# Patient Record
Sex: Male | Born: 1965 | Race: White | Hispanic: No | Marital: Married | Smoking: Former smoker
Health system: Southern US, Community
[De-identification: ages and names within clinical notes are randomized; demographics above are authoritative.]

## PROBLEM LIST (undated history)

## (undated) DIAGNOSIS — I10 Essential (primary) hypertension: Secondary | ICD-10-CM

## (undated) DIAGNOSIS — G629 Polyneuropathy, unspecified: Secondary | ICD-10-CM

## (undated) DIAGNOSIS — M545 Low back pain, unspecified: Secondary | ICD-10-CM

## (undated) DIAGNOSIS — G43109 Migraine with aura, not intractable, without status migrainosus: Secondary | ICD-10-CM

## (undated) DIAGNOSIS — E785 Hyperlipidemia, unspecified: Secondary | ICD-10-CM

## (undated) DIAGNOSIS — K219 Gastro-esophageal reflux disease without esophagitis: Secondary | ICD-10-CM

## (undated) DIAGNOSIS — F319 Bipolar disorder, unspecified: Secondary | ICD-10-CM

## (undated) DIAGNOSIS — J40 Bronchitis, not specified as acute or chronic: Secondary | ICD-10-CM

## (undated) DIAGNOSIS — J189 Pneumonia, unspecified organism: Secondary | ICD-10-CM

## (undated) DIAGNOSIS — G4730 Sleep apnea, unspecified: Secondary | ICD-10-CM

## (undated) DIAGNOSIS — E291 Testicular hypofunction: Secondary | ICD-10-CM

## (undated) DIAGNOSIS — E669 Obesity, unspecified: Secondary | ICD-10-CM

## (undated) DIAGNOSIS — G8929 Other chronic pain: Secondary | ICD-10-CM

## (undated) HISTORY — DX: Bipolar disorder, unspecified: F31.9

## (undated) HISTORY — DX: Testicular hypofunction: E29.1

## (undated) HISTORY — DX: Polyneuropathy, unspecified: G62.9

## (undated) HISTORY — DX: Gastro-esophageal reflux disease without esophagitis: K21.9

## (undated) HISTORY — DX: Migraine with aura, not intractable, without status migrainosus: G43.109

## (undated) HISTORY — DX: Sleep apnea, unspecified: G47.30

## (undated) HISTORY — DX: Low back pain, unspecified: M54.50

## (undated) HISTORY — DX: Essential (primary) hypertension: I10

## (undated) HISTORY — DX: Low back pain: M54.5

## (undated) HISTORY — DX: Obesity, unspecified: E66.9

## (undated) HISTORY — DX: Hyperlipidemia, unspecified: E78.5

## (undated) HISTORY — DX: Other chronic pain: G89.29

## (undated) HISTORY — PX: INNER EAR SURGERY: SHX679

---
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
## 1999-08-07 HISTORY — PX: BACK SURGERY: SHX140

## 2011-01-03 ENCOUNTER — Emergency Department: Payer: Self-pay | Admitting: Emergency Medicine

## 2011-01-29 ENCOUNTER — Emergency Department (INDEPENDENT_AMBULATORY_CARE_PROVIDER_SITE_OTHER): Payer: 59

## 2011-01-29 ENCOUNTER — Emergency Department (HOSPITAL_BASED_OUTPATIENT_CLINIC_OR_DEPARTMENT_OTHER)
Admission: EM | Admit: 2011-01-29 | Discharge: 2011-01-29 | Disposition: A | Discharge status: Home / Self Care | Payer: 59 | Attending: Emergency Medicine | Admitting: Emergency Medicine

## 2011-01-29 DIAGNOSIS — W19XXXA Unspecified fall, initial encounter: Secondary | ICD-10-CM

## 2011-01-29 DIAGNOSIS — M79609 Pain in unspecified limb: Secondary | ICD-10-CM | POA: Insufficient documentation

## 2011-01-29 DIAGNOSIS — G8929 Other chronic pain: Secondary | ICD-10-CM | POA: Insufficient documentation

## 2011-01-29 DIAGNOSIS — K219 Gastro-esophageal reflux disease without esophagitis: Secondary | ICD-10-CM | POA: Insufficient documentation

## 2011-01-29 DIAGNOSIS — I10 Essential (primary) hypertension: Secondary | ICD-10-CM | POA: Insufficient documentation

## 2011-03-18 ENCOUNTER — Emergency Department (INDEPENDENT_AMBULATORY_CARE_PROVIDER_SITE_OTHER): Payer: 59

## 2011-03-18 ENCOUNTER — Emergency Department (HOSPITAL_BASED_OUTPATIENT_CLINIC_OR_DEPARTMENT_OTHER)
Admission: EM | Admit: 2011-03-18 | Discharge: 2011-03-19 | Disposition: A | Discharge status: Home / Self Care | Payer: 59 | Attending: Emergency Medicine | Admitting: Emergency Medicine

## 2011-03-18 ENCOUNTER — Other Ambulatory Visit: Payer: Self-pay

## 2011-03-18 ENCOUNTER — Encounter: Payer: Self-pay | Admitting: *Deleted

## 2011-03-18 DIAGNOSIS — R05 Cough: Secondary | ICD-10-CM | POA: Insufficient documentation

## 2011-03-18 DIAGNOSIS — I498 Other specified cardiac arrhythmias: Secondary | ICD-10-CM | POA: Insufficient documentation

## 2011-03-18 DIAGNOSIS — R0602 Shortness of breath: Secondary | ICD-10-CM | POA: Insufficient documentation

## 2011-03-18 DIAGNOSIS — R000 Tachycardia, unspecified: Secondary | ICD-10-CM

## 2011-03-18 DIAGNOSIS — R0989 Other specified symptoms and signs involving the circulatory and respiratory systems: Secondary | ICD-10-CM

## 2011-03-18 DIAGNOSIS — R059 Cough, unspecified: Secondary | ICD-10-CM | POA: Insufficient documentation

## 2011-03-18 DIAGNOSIS — X58XXXA Exposure to other specified factors, initial encounter: Secondary | ICD-10-CM

## 2011-03-18 DIAGNOSIS — Z79899 Other long term (current) drug therapy: Secondary | ICD-10-CM | POA: Insufficient documentation

## 2011-03-18 DIAGNOSIS — S22009A Unspecified fracture of unspecified thoracic vertebra, initial encounter for closed fracture: Secondary | ICD-10-CM

## 2011-03-18 HISTORY — DX: Pneumonia, unspecified organism: J18.9

## 2011-03-18 HISTORY — DX: Bronchitis, not specified as acute or chronic: J40

## 2011-03-18 LAB — CARDIAC PANEL(CRET KIN+CKTOT+MB+TROPI): Total CK: 730 U/L — ABNORMAL HIGH (ref 7–232)

## 2011-03-18 LAB — DIFFERENTIAL
Eosinophils Relative: 1 % (ref 0–5)
Lymphocytes Relative: 21 % (ref 12–46)
Lymphs Abs: 2.5 10*3/uL (ref 0.7–4.0)
Monocytes Absolute: 1.2 10*3/uL — ABNORMAL HIGH (ref 0.1–1.0)
Monocytes Relative: 11 % (ref 3–12)

## 2011-03-18 LAB — BASIC METABOLIC PANEL
CO2: 25 mEq/L (ref 19–32)
Calcium: 9.1 mg/dL (ref 8.4–10.5)
Creatinine, Ser: 1.1 mg/dL (ref 0.50–1.35)

## 2011-03-18 LAB — CBC
HCT: 37.7 % — ABNORMAL LOW (ref 39.0–52.0)
MCV: 92.4 fL (ref 78.0–100.0)
RBC: 4.08 MIL/uL — ABNORMAL LOW (ref 4.22–5.81)
RDW: 12.7 % (ref 11.5–15.5)
WBC: 11.6 10*3/uL — ABNORMAL HIGH (ref 4.0–10.5)

## 2011-03-18 MED ORDER — ALBUTEROL SULFATE HFA 108 (90 BASE) MCG/ACT IN AERS
2.0000 | INHALATION_SPRAY | Freq: Once | RESPIRATORY_TRACT | Status: AC
  Administered 2011-03-19: 2 via RESPIRATORY_TRACT
  Filled 2011-03-18: qty 6.7

## 2011-03-18 MED ORDER — ALBUTEROL SULFATE HFA 108 (90 BASE) MCG/ACT IN AERS
2.0000 | INHALATION_SPRAY | RESPIRATORY_TRACT | Status: DC | PRN
Start: 2011-03-18 — End: 2013-02-09

## 2011-03-18 MED ORDER — AZITHROMYCIN 250 MG PO TABS: ORAL_TABLET | ORAL | Status: DC

## 2011-03-18 MED ORDER — IOHEXOL 350 MG/ML SOLN
80.0000 mL | Freq: Once | INTRAVENOUS | Status: AC | PRN
  Administered 2011-03-18: 80 mL via INTRAVENOUS

## 2011-03-18 MED ORDER — HYDROCOD POLST-CHLORPHEN POLST 10-8 MG/5ML PO LQCR
5.0000 mL | Freq: Once | ORAL | Status: AC
  Administered 2011-03-18: 5 mL via ORAL
  Filled 2011-03-18: qty 5

## 2011-03-18 MED ORDER — AZITHROMYCIN 250 MG PO TABS
500.0000 mg | ORAL_TABLET | Freq: Once | ORAL | Status: AC
  Administered 2011-03-18: 500 mg via ORAL
  Filled 2011-03-18: qty 2

## 2011-03-18 MED ORDER — HYDROCOD POLST-CHLORPHEN POLST 10-8 MG/5ML PO LQCR: ORAL | Status: DC

## 2011-03-18 NOTE — Patient Instructions (Signed)
Patient instructed on the correct method of effort to acquire PEFT. Pt's effort was excellent with readings of 400/500/500 with an average of 466. This being 73% of 637 being 73% of expected values.

## 2011-03-18 NOTE — ED Notes (Signed)
Pt states he has had a cough x 1 month with increased SHOB after his coughing episodes.

## 2011-03-18 NOTE — ED Provider Notes (Signed)
History   Scribed for [name], MD, the patient was seen in room MH04/MH04 . This chart was scribed by Desma Paganini. This patient's care was started at 8:42 PM.   CSN: [id number] Arrival date & time: 03/18/2011  8:51 PM  Chief Complaint  Patient presents with  . Cough   HPI David Prince is a 45 y.o. male who presents to the Emergency Department complaining of cough. Pt reports persistent cough x 1 month that is gradually worsening with increased SOB worse with exertion, deep breathing, and talking. Pt states uncontrollable coughing spells with associated substernal chest pain that only occurs with deep breathing and coughing. Also c/o fatigue and generalized weakness. +wheezing at night per wife. Denies swelling in legs, previous long trips, or a hx of blood clots. Reports using wife's Ventilin with transient relief.  Approximately one month ago, pt was dx with minor bronchitis at [facility] with same sx, given rx albuterol and Prednisone but sx continued to worsen. Unknown last pertussis dosing.  Pt reports hx of heart murmur, heart septum defect, as well as abnormal echo 4 years ago showing a "thing" in his heart but unsure of details. Pt was on Coumadin and ASA following echo findings but is no longer taking d/t severe bleeding with kidney stones. Takes Prevacid for moderate to severe acid reflux, 30mg  2x per day. Wife reports some cough at night thought to be due to reflux sxs, but this type of cough is usually followed by change in voice and acid brash in mouth in am.  No chest trauma.  No inhalational injury.  Pt was a smoker, but quit 4 years ago.    PCP Ozzie Hoyle   HPI ELEMENTS:  Duration:one month Timing: episodic Quality: non productive cough with SOB and chest pain Severity: moderate to severe Modifying factors: exertion, talking, deep breathing Context: as above  Associated symptoms: wheezing at night, fatigue and generalized weakness  PAST MEDICAL HISTORY:    Past Medical History  Diagnosis Date  . Bronchitis   . Pneumonia     PAST SURGICAL HISTORY:  Past Surgical History  Procedure Date  . Leg surgery   . Back surgery   . Inner ear surgery     MEDICATIONS:  Previous Medications   ARIPIPRAZOLE (ABILIFY) 10 MG TABLET    Take 10 mg by mouth daily.     CHOLECALCIFEROL (VITAMIN D) 1000 UNITS TABLET    Take 1,000 Units by mouth daily.     GABAPENTIN (NEURONTIN) 300 MG CAPSULE    Take 900 mg by mouth 3 (three) times daily.     IBUPROFEN (ADVIL,MOTRIN) 200 MG TABLET    Take 200 mg by mouth once as needed. For pain    LANSOPRAZOLE (PREVACID) 30 MG CAPSULE    Take 30 mg by mouth 2 (two) times daily.     METOPROLOL (LOPRESSOR) 50 MG TABLET    Take 25 mg by mouth daily.     MULTIPLE VITAMINS-MINERALS (ONE DAILY MENS) TABS    Take 1 tablet by mouth daily.     SIMVASTATIN PO    Take 0.5 tablets by mouth daily.     VILAZODONE HCL (VIIBRYD) 10 MG TABS    Take 5 mg by mouth every other day.        ALLERGIES:  Allergies as of 03/18/2011  . (No Known Allergies)     FAMILY HISTORY:  History reviewed. No pertinent family history.   SOCIAL HISTORY: Quit smoking 4 years ago  Review of Systems 10 Systems reviewed and are negative for acute change except as noted in the HPI.  Physical Exam  BP 117/76  Pulse 109  Temp(Src) 98.5 F (36.9 C) (Oral)  Resp 23  Ht 5\' 11"  (1.803 m)  Wt 220 lb (99.791 kg)  BMI 30.68 kg/m2  SpO2 97%  Physical Exam  Nursing note and vitals reviewed. Constitutional: He is oriented to person, place, and time. He appears well-developed and well-nourished.  HENT:  Head: Normocephalic and atraumatic.  Neck: Normal range of motion. Neck supple.  Cardiovascular: Normal heart sounds and intact distal pulses.  Tachycardia present.  Exam reveals no gallop.   No murmur heard. Pulmonary/Chest: Effort normal and breath sounds normal. He has no wheezes.       Coughing with deep breathing  Abdominal: Bowel sounds are  normal. He exhibits no distension. There is no tenderness.  Musculoskeletal: Normal range of motion. He exhibits no edema and no tenderness.       Pedal pulses intact   Neurological: He is alert and oriented to person, place, and time. No cranial nerve deficit.  Skin: Skin is warm and dry. No rash noted.  Psychiatric: He has a normal mood and affect.     ED Course  Procedures  OTHER DATA REVIEWED: Nursing notes and vital signs reviewed. Prior records reviewed.   DIAGNOSTIC STUDIES: Oxygen Saturation is 100% on room air, normal by my Interpretation.  EKG:   Date: 03/18/2011  Rate: 101   Rhythm: sinus tachycardia  QRS Axis: normal  Intervals: normal  ST/T Wave abnormalities: normal  Conduction Disutrbances:none  Narrative Interpretation: Q wave noted in III  Old EKG Reviewed: none available   Cardiac Monitor: 03/18/2011 8:45PM Sinus tachycardia at 106   LABS / RADIOLOGY: Results for orders placed during the hospital encounter of 03/18/11  BASIC METABOLIC PANEL      Component Value Range   Sodium 141  135 - 145 (mEq/L)   Potassium 3.6  3.5 - 5.1 (mEq/L)   Chloride 103  96 - 112 (mEq/L)   CO2 25  19 - 32 (mEq/L)   Glucose, Bld 78  70 - 99 (mg/dL)   BUN 14  6 - 23 (mg/dL)   Creatinine, Ser 0.45  0.50 - 1.35 (mg/dL)   Calcium 9.1  8.4 - 40.9 (mg/dL)   GFR calc non Af Amer >60  >60 (mL/min)   GFR calc Af Amer >60  >60 (mL/min)  CARDIAC PANEL(CRET KIN+CKTOT+MB+TROPI)      Component Value Range   Total CK 730 (*) 7 - 232 (U/L)   CK, MB 4.8 (*) 0.3 - 4.0 (ng/mL)   Troponin I <0.30  <0.30 (ng/mL)   Relative Index 0.7  0.0 - 2.5   CBC      Component Value Range   WBC 11.6 (*) 4.0 - 10.5 (K/uL)   RBC 4.08 (*) 4.22 - 5.81 (MIL/uL)   Hemoglobin 13.2  13.0 - 17.0 (g/dL)   HCT 81.1 (*) 91.4 - 52.0 (%)   MCV 92.4  78.0 - 100.0 (fL)   MCH 32.4  26.0 - 34.0 (pg)   MCHC 35.0  30.0 - 36.0 (g/dL)   RDW 78.2  95.6 - 21.3 (%)   Platelets 183  150 - 400 (K/uL)  DIFFERENTIAL        Component Value Range   Neutrophils Relative 67  43 - 77 (%)   Neutro Abs 7.8 (*) 1.7 - 7.7 (K/uL)   Lymphocytes Relative 21  12 - 46 (%)   Lymphs Abs 2.5  0.7 - 4.0 (K/uL)   Monocytes Relative 11  3 - 12 (%)   Monocytes Absolute 1.2 (*) 0.1 - 1.0 (K/uL)   Eosinophils Relative 1  0 - 5 (%)   Eosinophils Absolute 0.1  0.0 - 0.7 (K/uL)   Basophils Relative 0  0 - 1 (%)   Basophils Absolute 0.0  0.0 - 0.1 (K/uL)  SEDIMENTATION RATE      Component Value Range   Sed Rate 3  0 - 16 (mm/hr)   Ct Angio Chest W/cm &/or Wo Cm  03/18/2011  *RADIOLOGY REPORT*  Clinical Data:  Cough.  Wheezing.  Fatigue.  CT ANGIOGRAPHY CHEST WITH CONTRAST  Technique:  Multidetector CT imaging of the chest was performed using the standard protocol during bolus administration of intravenous contrast.  Multiplanar CT image reconstructions including MIPs were obtained to evaluate the vascular anatomy.  Contrast:  80 ml Omnipaque 350  Comparison:  None.  Findings:  No filling defect is identified in the pulmonary arterial tree to suggest pulmonary embolus.  No aortic dissection identified.  No cardiomegaly noted.  No thoracic adenopathy is identified.  No pleural effusion noted.  Faint reticulonodular interstitial opacity in the right upper lobe likely represents atypical infectious bronchiolitis.  Diffuse steatosis of the visualized portion of the liver is noted.  Linear subsegmental atelectasis is present anteriorly in the right lower lobe.  A T4 compression fracture is noted, age indeterminate, without significant bony retropulsion.  Review of the MIP images confirms the above findings.  IMPRESSION:  1.  No embolus. 2.  Faint reticulonodular interstitial opacity in the right upper lobe suggests atypical infectious bronchiolitis. 3.  Compression fracture at T4 likely old; correlate with any back pain or trauma.  4.  One of the history items indicates "mass in heart?"  - Cardiac MRI, echocardiogram, or cardiac gated CT could  be utilized to assess for a mass if clinically warranted. 5.  Diffuse hepatic steatosis.  Original Report Authenticated By: Dellia Cloud, M.D.    MDM: Pt with one month of worsening cough, now with worsening sob with exertion and cough, some chest wall/sternal pain with inspiration, cough.  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  Ddx includes pertussis, PE, chf, chronic cough from gerd, larygospasm among others.  Will check ekg, cardiac markers, bmp, cbc, and CTA chest.  If negative, will place on scheduled albuterol, and have close f/u with pcm for further workup of cough/dyspnea with repeat echo, possible bronchoscope, pertussis titers.   CTA without PE, possible atypical infection.  Will tx with zithromax, tussionex, and albuterol.  Findings relayed to pt and wife.  Close follow up encouraged.   IMPRESSION: 1. Shortness of breath   2. Persistent cough   3. Sinus tachycardia      PLAN: Discharge  The patient is to return the emergency department if there is any worsening of symptoms. I have reviewed the discharge instructions with the patient and family   CONDITION ON DISCHARGE: stable  MEDS GIVEN IN ED: iohexol (OMNIPAQUE) 350 MG/ML injection 80 mL (80 mL Intravenous Contrast Given 03/18/11 2228)     SCRIBE ATTESTATION: I personally performed the services described in this documentation, which was scribed in my presence. The recorded information has been reviewed and considered. [name], MD         Rhona Leavens  Norlene Campbell, MD 03/19/11 0005

## 2011-03-19 NOTE — Discharge Instructions (Signed)
Please take medications as prescribed.  Follow up with your doctor for further workup, possibly to include repeat echo given your history of abnormal findings in the past.  You may need testing for pertussis/whooping cough.  Return to the ER for worsening condition or new concerning symptoms.   Tachycardia - Nonspecific In adults, the heart normally beats between 60 and 100 times a minute. A heart rate over 100 is called tachycardia. When your heart beats too fast, it may not be able to pump enough blood to the rest of the body. SYMPTOMS  Palpitations (rapid or irregular heartbeat).   Dizziness.   Tiredness (fatigue).   Shortness of breath.  CAUSES  Exercise or exertion.  Fever.   Pain or injury.   Infection.   Loss of fluid (dehydration).   Overactive thyroid.   Lack of red blood cells (anemia).   Anxiety.   Alcohol.  Heart arrhythmia.   Caffeine.   Tobacco products.   Diet pills.   Street drugs.   Heart disease.   DIAGNOSIS After an exam and taking a history, your caregiver may order:  Blood tests.   Electrocardiogram (EKG).   Heart monitor.  TREATMENT Treatment will depend on the cause and potential for harm. It may include:  Intravenous (IV) replacement of fluids or blood.   Antidote or reversal medicines.   Changes in your present medicines.   Lifestyle changes.  HOME CARE INSTRUCTIONS  Get rest.   Drink enough water and fluids to keep your urine clear or pale yellow.   Avoid:   Caffeine.  Nicotine.   Alcohol.   Stress.  Chocolate.   Stimulants.    Only take medicine as directed by your caregiver.  SEEK IMMEDIATE MEDICAL CARE IF:  You have pain in your chest, upper arms, jaw, or neck.   You become weak, dizzy, or feel faint.   You have palpitations that will not go away.   You throw up (vomit), have diarrhea, or pass blood.   You look pale and your skin is cool and wet.  MAKE SURE YOU:  Understand these instructions.     Will watch your condition.   Will get help right away if you are not doing well or get worse.  Document Released: 08/30/2004 Document Re-Released: 10/17/2009 [facility] Patient Information 2011 [place], [place].Marland Kitchen  Dyspnea (Shortness of Breath) Shortness of breath (dyspnea) is the feeling of uneasy breathing. Dyspnea does not always mean that there is a life-threatening illness. Dyspnea should be evaluated promptly. DIAGNOSIS Many tests may be done to find why you are having shortness of breath. Tests may include:  A chest X-ray.  A lung function test.   Blood tests.   Recordings of the electrical activity of the heart (electrocardiogram).  Exercise testing.   Sound wave images of the heart (a cardiac echocardiogram).  A scan.   A cause for your shortness of breath may not be identified initially. In this case, it is important to have a follow-up exam with your caregiver. HOME CARE INSTRUCTIONS  Do not smoke. Smoking is a common cause of shortness of breath. Ask for help to stop smoking.   Avoid being around chemicals that may bother your breathing, such as paint fumes or dust.   Rest as needed. Slowly begin your usual activities.   If medications were prescribed, take them as directed for the full length of time directed. This includes oxygen and any inhaled medications, if prescribed.   It is very important that you follow  up with your caregiver or other physician as directed. Waiting to do so or failure to follow up could result in worsening of your condition, possible disability, or death.   Be sure you understand what to do or who to call if your shortness of breath worsens.  SEEK MEDICAL CARE IF:  Your condition does not improve in the time expected.   You have a hard time doing your normal activities even with rest.   You have any side effects from or problems with medications prescribed.  SEEK IMMEDIATE MEDICAL CARE IF:  You feel your shortness of breath is  getting worse.   You feel lightheaded, faint or develop a cough not controlled with medications.   You start coughing up blood.   You get pain with breathing.   You get chest pain or pain in your arms, shoulders or belly (abdomen).   You develop an oral temperature above 101, not controlled by medication.   You are unable to walk up stairs or exercise the way you normally can.  MAKE SURE YOU:  Understand these instructions.   Will watch your condition.   Will get help right away if you are not doing well or get worse.  Document Released: 08/30/2004 Document Re-Released: 01/10/2010 [facility] Patient Information 2011 [place], [place].  Cough, Bacterial You have been seen today for a cough. Your caregiver feels that your cough is caused by germs (bacterial). Often, infections of the lungs are caused by bacteria. Usually, these infections are caused by inhaling bacteria into your throat and lungs.   SYMPTOMS The most common symptoms are cough, fever, chest pain, increased breathing rate, wheezing, and colored secretions you cough up (sputum). These infections are diagnosed by your caregiver physically examining you.  DIAGNOSIS These infections are diagnosed by your caregiver physically examining you. Other times the diagnosis may require chest X-rays, blood analysis, and/or cultures. The bacterial pneumonias are usually cleared up with medications which kill germs (antibiotics). The following information may or may not apply to you. It is possible that some of your lab work or X-rays may needed to make a final diagnosis. Sometimes some of these tests require repeating.  X-rays may have been taken today and will be read again by a radiologist. The official reading will be back in 1 to 2 days.   Lab work may have been done today and should be back within 24 hours. Call in 1 day for your lab results or as directed.   Cultures may have been done today. These generally take at least 2 days  for results. Call in 2 to 3 days for your results or as directed.   You may have had special procedures done today and the results will not be available for a few days. Call for your results as directed.  Not all test results are available during your visit. If your test results are not back during the visit, make an appointment with your caregiver to find out the results. Do not assume everything is normal if you have not heard from your caregiver or the medical facility. It is important for you to follow up on all of your test results.  HOME CARE INSTRUCTIONS  Cough suppressants may be used as directed by your caregiver. Keep in mind that coughing helps clear mucus and infection out of the respiratory tract. It is best to only use cough suppressants to allow your child to rest. For children under the age of 4 years, use cough suppressants only  as directed by your child's caregiver.   Your caregiver usually prescribes an antibiotic if they feel your cough is caused by a bacterial infection. Take all your antibiotics as directed until they are gone, even if you feel better in a couple of days.   Your caregiver may also prescribe an expectorant to loosen the mucous (sputum).   Only take over-the-counter or prescription medicines for pain, discomfort, or fever as directed by your caregiver.   Do not use aspirin with children because of the association with Reye's Syndrome. The cough could be caused by a flu bug (virus).   Smoking is a common cause of bronchitis and can contribute to pneumonia. Stopping this habit is important.   A cold steam vaporizer or humidifier in your room may help loosen secretions.   Cough is often worse at night. Sleeping in a semi-upright position or using a couple pillows under your head and shoulders will help with this.   Get rest as you feel it is needed. Your body will help guide you in this manner.  SEEK IMMEDIATE MEDICAL CARE IF:  You develop pus-like or  colored (purulent) sputum, have an uncontrolled fever, or become more ill. This is especially true if you are elderly or weakened from any other disease.   You can not control your cough with suppressants and are losing sleep.   You begin coughing up blood.   You develop chest pain that is getting worse or is uncontrolled with medicines.   Anytime it becomes difficult to breathe (shortness of breath).   You develop an oral temperature above 101, after being not having a temperature for one or more days.   If any of the symptoms that first brought you in for care are getting worse rather than better.  MAKE SURE YOU:   Understand these instructions.   Will watch your condition.   Will get help right away if you are not doing well or get worse.  Document Released: 10/13/2003 Document Re-Released: 01/10/2010 [facility] Patient Information 2011 [place], [place].

## 2013-01-05 ENCOUNTER — Emergency Department (HOSPITAL_BASED_OUTPATIENT_CLINIC_OR_DEPARTMENT_OTHER): Payer: 59

## 2013-01-05 ENCOUNTER — Emergency Department (HOSPITAL_BASED_OUTPATIENT_CLINIC_OR_DEPARTMENT_OTHER)
Admission: EM | Admit: 2013-01-05 | Discharge: 2013-01-06 | Disposition: A | Discharge status: Home / Self Care | Payer: 59 | Attending: Emergency Medicine | Admitting: Emergency Medicine

## 2013-01-05 ENCOUNTER — Encounter (HOSPITAL_BASED_OUTPATIENT_CLINIC_OR_DEPARTMENT_OTHER): Payer: Self-pay | Admitting: Emergency Medicine

## 2013-01-05 DIAGNOSIS — L255 Unspecified contact dermatitis due to plants, except food: Secondary | ICD-10-CM | POA: Insufficient documentation

## 2013-01-05 DIAGNOSIS — J9801 Acute bronchospasm: Secondary | ICD-10-CM

## 2013-01-05 DIAGNOSIS — L237 Allergic contact dermatitis due to plants, except food: Secondary | ICD-10-CM

## 2013-01-05 DIAGNOSIS — R0789 Other chest pain: Secondary | ICD-10-CM | POA: Insufficient documentation

## 2013-01-05 DIAGNOSIS — Z8701 Personal history of pneumonia (recurrent): Secondary | ICD-10-CM | POA: Insufficient documentation

## 2013-01-05 DIAGNOSIS — R0602 Shortness of breath: Secondary | ICD-10-CM | POA: Insufficient documentation

## 2013-01-05 MED ORDER — PREDNISONE 50 MG PO TABS: 50.0000 mg | ORAL_TABLET | Freq: Every day | ORAL | Status: DC

## 2013-01-05 MED ORDER — ALBUTEROL SULFATE HFA 108 (90 BASE) MCG/ACT IN AERS: 2.0000 | INHALATION_SPRAY | RESPIRATORY_TRACT | Status: DC | PRN

## 2013-01-05 MED ORDER — DIPHENHYDRAMINE HCL 25 MG PO CAPS
25.0000 mg | ORAL_CAPSULE | Freq: Once | ORAL | Status: AC
  Administered 2013-01-05: 25 mg via ORAL
  Filled 2013-01-05: qty 1

## 2013-01-05 MED ORDER — LORAZEPAM 1 MG PO TABS
2.0000 mg | ORAL_TABLET | Freq: Once | ORAL | Status: AC
  Administered 2013-01-05: 2 mg via ORAL
  Filled 2013-01-05: qty 2

## 2013-01-05 MED ORDER — IPRATROPIUM BROMIDE 0.02 % IN SOLN
0.5000 mg | Freq: Once | RESPIRATORY_TRACT | Status: AC
  Administered 2013-01-05: 0.5 mg via RESPIRATORY_TRACT
  Filled 2013-01-05: qty 2.5

## 2013-01-05 MED ORDER — PREDNISONE 50 MG PO TABS
60.0000 mg | ORAL_TABLET | Freq: Once | ORAL | Status: AC
  Administered 2013-01-05: 60 mg via ORAL
  Filled 2013-01-05: qty 1

## 2013-01-05 MED ORDER — FAMOTIDINE 20 MG PO TABS
20.0000 mg | ORAL_TABLET | Freq: Once | ORAL | Status: AC
  Administered 2013-01-05: 20 mg via ORAL
  Filled 2013-01-05: qty 1

## 2013-01-05 MED ORDER — LORAZEPAM 1 MG PO TABS: 1.0000 mg | ORAL_TABLET | Freq: Three times a day (TID) | ORAL | Status: DC | PRN

## 2013-01-05 MED ORDER — ALBUTEROL SULFATE (5 MG/ML) 0.5% IN NEBU
2.5000 mg | INHALATION_SOLUTION | Freq: Once | RESPIRATORY_TRACT | Status: AC
  Administered 2013-01-05: 2.5 mg via RESPIRATORY_TRACT
  Filled 2013-01-05: qty 0.5

## 2013-01-05 NOTE — ED Provider Notes (Signed)
History    This chart was scribed for Dione Booze, MD by Donne Anon, ED Scribe. This patient was seen in room MH10/MH10 and the patient's care was started at 2203.   CSN: [id number]  Arrival date & time 01/05/13  2045   First MD Initiated Contact with Patient 01/05/13 2203      Chief Complaint  Patient presents with  . Rash  . Shortness of Breath     The history is provided by the patient. No language interpreter was used.   HPI Comments: David Prince is a 47 y.o. male who presents to the Emergency Department complaining of 19 hours of gradual onset, gradually worsening, constant red rash to his legs, forearms, stomach, and back described as itchy. He reports associated SOB and chest tightness described as pressure. He reports he was burning brush from his yard this weekend. He has tried topical Benadryl with little relief. He denies any other pain.  Dr. Ladona Ridgel is his PCP.   Past Medical History  Diagnosis Date  . Bronchitis   . Pneumonia     Past Surgical History  Procedure Laterality Date  . Leg surgery    . Back surgery    . Inner ear surgery      No family history on file.  History  Substance Use Topics  . Smoking status: Never Smoker   . Smokeless tobacco: Not on file  . Alcohol Use: No      Review of Systems  Respiratory: Positive for chest tightness and shortness of breath.   Skin: Positive for rash.  All other systems reviewed and are negative.    Allergies  Review of patient's allergies indicates no known allergies.  Home Medications   Current Outpatient Rx  Name  Route  Sig  Dispense  Refill  . gabapentin (NEURONTIN) 300 MG capsule   Oral   Take 900 mg by mouth 3 (three) times daily.           Marland Kitchen ibuprofen (ADVIL,MOTRIN) 200 MG tablet   Oral   Take 200 mg by mouth once as needed. For pain          . lansoprazole (PREVACID) 30 MG capsule   Oral   Take 30 mg by mouth 2 (two) times daily.           . metoprolol (LOPRESSOR) 50  MG tablet   Oral   Take 25 mg by mouth daily.           . Multiple Vitamins-Minerals (ONE DAILY MENS) TABS   Oral   Take 1 tablet by mouth daily.           Marland Kitchen SIMVASTATIN PO   Oral   Take 0.5 tablets by mouth daily.           Marland Kitchen EXPIRED: albuterol (PROVENTIL HFA;VENTOLIN HFA) 108 (90 BASE) MCG/ACT inhaler   Inhalation   Inhale 2 puffs into the lungs every 4 (four) hours as needed for wheezing.   1 Inhaler   1   . ARIPiprazole (ABILIFY) 10 MG tablet   Oral   Take 10 mg by mouth daily.           Marland Kitchen azithromycin (ZITHROMAX) 250 MG tablet      One tab po q day x 4 days   4 tablet   0   . chlorpheniramine-HYDROcodone (TUSSIONEX) 10-8 MG/5ML LQCR      10 ml po qhs prn cough   140 mL   0   .  cholecalciferol (VITAMIN D) 1000 UNITS tablet   Oral   Take 1,000 Units by mouth daily.           . Vilazodone HCl (VIIBRYD) 10 MG TABS   Oral   Take 5 mg by mouth every other day.             BP 138/87  Pulse 82  Temp(Src) 98 F (36.7 C) (Oral)  Resp 18  Ht 5\' 11"  (1.803 m)  Wt 230 lb (104.327 kg)  BMI 32.09 kg/m2  SpO2 100%  Physical Exam  Nursing note and vitals reviewed. Constitutional: He is oriented to person, place, and time. He appears well-developed and well-nourished. No distress.  HENT:  Head: Normocephalic and atraumatic.  Eyes: EOM are normal.  Neck: Neck supple. No tracheal deviation present.  Cardiovascular: Normal rate, regular rhythm and normal heart sounds.   Pulmonary/Chest: Effort normal and breath sounds normal. No respiratory distress.  Abdominal: Soft. There is no tenderness.  Musculoskeletal: Normal range of motion.  Neurological: He is alert and oriented to person, place, and time.  Skin: Skin is warm and dry. Rash noted.  Diffuse rash with vesicles on erythematous base some with linear pattern, some excoriation consistent with poison ivy.  Psychiatric: He has a normal mood and affect. His behavior is normal.    ED Course   Procedures (including critical care time) DIAGNOSTIC STUDIES: Oxygen Saturation is 100% on RA, normal by my interpretation.    COORDINATION OF CARE: 10:07 PM Discussed treatment plan which includes CXR, Prednisone and breathing treatment with pt at bedside and pt agreed to plan. Advised pt to use Claritin or Zyrtec. Also advised to use Zantac or Pepcid.    Dg Chest 2 View  01/05/2013   *RADIOLOGY REPORT*  Clinical Data: Shortness of breath.  Rash.  CHEST - 2 VIEW  Comparison: No priors.  Findings: Lung volumes are normal.  No consolidative airspace disease.  No pleural effusions.  No pneumothorax.  No pulmonary nodule or mass noted.  Pulmonary vasculature and the cardiomediastinal silhouette are within normal limits.  IMPRESSION: 1. No radiographic evidence of acute cardiopulmonary disease.   Original Report Authenticated By: Trudie Reed, M.D.     1. Poison ivy dermatitis   2. Bronchospasm       MDM  Poison ivy dermatitis. He also seems to have some bronchospasm which is probably related to inhaling fumes of current poison ivy. I do not see any evidence of her right compromise. Chest x-ray or be obtained a new be given albuterol with Atrovent as well as oral prednisone and a oral diphenhydramine and famotidine.  His breathing is much better following albuterol with Atrovent and he no longer feels like his chest is tight. Itching is not significantly improved. He'll be given a dose of lorazepam and reassessed. I anticipate discharging him with prescriptions for prednisone, albuterol, and lorazepam and have instructed him on using over-the-counter Claritin or Zyrtec as well as over-the-counter famotidine or ranitidine.    I personally performed the services described in this documentation, which was scribed in my presence. The recorded information has been reviewed and is accurate.     Dione Booze, MD 01/05/13 2351

## 2013-01-05 NOTE — ED Notes (Signed)
MD at bedside. 

## 2013-01-05 NOTE — ED Notes (Signed)
Pt in NAD

## 2013-01-05 NOTE — ED Notes (Signed)
Pt has red rash all over and feels short of breath with chest tightness. Pt reports onset of symptoms at 3am today. Pt states he burned brush over the weekend

## 2013-02-09 ENCOUNTER — Emergency Department (HOSPITAL_BASED_OUTPATIENT_CLINIC_OR_DEPARTMENT_OTHER)
Admission: EM | Admit: 2013-02-09 | Discharge: 2013-02-09 | Disposition: A | Discharge status: Home / Self Care | Payer: 59 | Attending: Emergency Medicine | Admitting: Emergency Medicine

## 2013-02-09 ENCOUNTER — Encounter (HOSPITAL_BASED_OUTPATIENT_CLINIC_OR_DEPARTMENT_OTHER): Payer: Self-pay | Admitting: *Deleted

## 2013-02-09 ENCOUNTER — Emergency Department (HOSPITAL_BASED_OUTPATIENT_CLINIC_OR_DEPARTMENT_OTHER): Payer: 59

## 2013-02-09 DIAGNOSIS — Z792 Long term (current) use of antibiotics: Secondary | ICD-10-CM | POA: Insufficient documentation

## 2013-02-09 DIAGNOSIS — Z8709 Personal history of other diseases of the respiratory system: Secondary | ICD-10-CM | POA: Insufficient documentation

## 2013-02-09 DIAGNOSIS — Z8701 Personal history of pneumonia (recurrent): Secondary | ICD-10-CM | POA: Insufficient documentation

## 2013-02-09 DIAGNOSIS — Z9889 Other specified postprocedural states: Secondary | ICD-10-CM | POA: Insufficient documentation

## 2013-02-09 DIAGNOSIS — M25552 Pain in left hip: Secondary | ICD-10-CM

## 2013-02-09 DIAGNOSIS — Z79899 Other long term (current) drug therapy: Secondary | ICD-10-CM | POA: Insufficient documentation

## 2013-02-09 DIAGNOSIS — G8911 Acute pain due to trauma: Secondary | ICD-10-CM | POA: Insufficient documentation

## 2013-02-09 DIAGNOSIS — M79609 Pain in unspecified limb: Secondary | ICD-10-CM | POA: Insufficient documentation

## 2013-02-09 DIAGNOSIS — Z87891 Personal history of nicotine dependence: Secondary | ICD-10-CM | POA: Insufficient documentation

## 2013-02-09 DIAGNOSIS — Z87828 Personal history of other (healed) physical injury and trauma: Secondary | ICD-10-CM | POA: Insufficient documentation

## 2013-02-09 MED ORDER — OXYCODONE-ACETAMINOPHEN 5-325 MG PO TABS
2.0000 | ORAL_TABLET | Freq: Once | ORAL | Status: AC
Start: 2013-02-09 — End: 2013-02-09
  Administered 2013-02-09: 2 via ORAL
  Filled 2013-02-09 (×2): qty 2

## 2013-02-09 MED ORDER — OXYCODONE-ACETAMINOPHEN 5-325 MG PO TABS: 1.0000 | ORAL_TABLET | Freq: Four times a day (QID) | ORAL | Status: DC | PRN

## 2013-02-09 NOTE — ED Notes (Signed)
Pt ambulating independently w/ steady gait on d/c in no acute distress, A&Ox4. D/c instructions reviewed w/ pt and family - pt and family deny any further questions or concerns at present. Rx given x1, Pt instructed to not use alcohol, drive, or operate heavy machinery while take the prescription pain medications as they could make him drowsy - pt verbalized understanding.    

## 2013-02-09 NOTE — ED Provider Notes (Signed)
History    CSN: [id number] Arrival date & time 02/09/13  0507  First MD Initiated Contact with Patient 02/09/13 [phone]     Chief Complaint  Patient presents with  . Leg Pain   (Consider location/radiation/quality/duration/timing/severity/associated sxs/prior Treatment) Patient is a 47 y.o. male presenting with leg pain.  Leg Pain  Pt with remote history of L leg injury from a fall with subsequent pinning about 30 years ago reports it 'flares up' from time to time. He reports he was in his normal state of health earlier tonight at work when he began to have aching in the left hip. He reports pain has become increasingly severe, worse with movement. More severe than typical flare ups that he treats with NSAIDs. Denies any falls or injury. No back pain, fever, numbness.   Past Medical History  Diagnosis Date  . Bronchitis   . Pneumonia    Past Surgical History  Procedure Laterality Date  . Leg surgery    . Back surgery    . Inner ear surgery     History reviewed. No pertinent family history. History  Substance Use Topics  . Smoking status: Former Games developer  . Smokeless tobacco: Not on file  . Alcohol Use: No    Review of Systems All other systems reviewed and are negative except as noted in HPI.   Allergies  Review of patient's allergies indicates no known allergies.  Home Medications   Current Outpatient Rx  Name  Route  Sig  Dispense  Refill  . EXPIRED: albuterol (PROVENTIL HFA;VENTOLIN HFA) 108 (90 BASE) MCG/ACT inhaler   Inhalation   Inhale 2 puffs into the lungs every 4 (four) hours as needed for wheezing.   1 Inhaler   1   . albuterol (PROVENTIL HFA;VENTOLIN HFA) 108 (90 BASE) MCG/ACT inhaler   Inhalation   Inhale 2 puffs into the lungs every 4 (four) hours as needed for wheezing or shortness of breath (or chest tightness/heaviness).   1 Inhaler   0   . ARIPiprazole (ABILIFY) 10 MG tablet   Oral   Take 10 mg by mouth daily.           Marland Kitchen azithromycin  (ZITHROMAX) 250 MG tablet      One tab po q day x 4 days   4 tablet   0   . chlorpheniramine-HYDROcodone (TUSSIONEX) 10-8 MG/5ML LQCR      10 ml po qhs prn cough   140 mL   0   . cholecalciferol (VITAMIN D) 1000 UNITS tablet   Oral   Take 1,000 Units by mouth daily.           Marland Kitchen gabapentin (NEURONTIN) 300 MG capsule   Oral   Take 900 mg by mouth 3 (three) times daily.           Marland Kitchen ibuprofen (ADVIL,MOTRIN) 200 MG tablet   Oral   Take 200 mg by mouth once as needed. For pain          . lansoprazole (PREVACID) 30 MG capsule   Oral   Take 30 mg by mouth 2 (two) times daily.           Marland Kitchen LORazepam (ATIVAN) 1 MG tablet   Oral   Take 1 tablet (1 mg total) by mouth 3 (three) times daily as needed for anxiety (or itching).   15 tablet   0   . metoprolol (LOPRESSOR) 50 MG tablet   Oral   Take 25 mg by mouth  daily.           . Multiple Vitamins-Minerals (ONE DAILY MENS) TABS   Oral   Take 1 tablet by mouth daily.           . predniSONE (DELTASONE) 50 MG tablet   Oral   Take 1 tablet (50 mg total) by mouth daily.   20 tablet   0   . SIMVASTATIN PO   Oral   Take 0.5 tablets by mouth daily.           . Vilazodone HCl (VIIBRYD) 10 MG TABS   Oral   Take 5 mg by mouth every other day.            BP 137/82  Pulse 102  Temp(Src) 98.1 F (36.7 C) (Oral)  Ht 5\' 11"  (1.803 m)  Wt 230 lb (104.327 kg)  BMI 32.09 kg/m2  SpO2 98% Physical Exam  Nursing note and vitals reviewed. Constitutional: He is oriented to person, place, and time. He appears well-developed and well-nourished.  HENT:  Head: Normocephalic and atraumatic.  Eyes: EOM are normal. Pupils are equal, round, and reactive to light.  Neck: Normal range of motion. Neck supple.  Cardiovascular: Normal rate, normal heart sounds and intact distal pulses.   Pulmonary/Chest: Effort normal and breath sounds normal.  Abdominal: Bowel sounds are normal. He exhibits no distension. There is no tenderness.   Musculoskeletal: He exhibits no edema and no tenderness (no tenderness to palpation).  Decreased ROM due to pain, particularly with internal and external rotation of the hip  Neurological: He is alert and oriented to person, place, and time. He has normal strength. No cranial nerve deficit or sensory deficit.  Skin: Skin is warm and dry. No rash noted.  Psychiatric: He has a normal mood and affect.    ED Course  Procedures (including critical care time) Labs Reviewed - No data to display Dg Hip Complete Left  02/09/2013   *RADIOLOGY REPORT*  Clinical Data: Left hip pain.  History of old injury.  LEFT HIP - COMPLETE 2+ VIEW  Comparison: Left hip 01/29/2011.  Findings: Postoperative changes in the lower lumbosacral spine and in the left hip.  The pelvic rim, SI joints, and symphysis pubis appear intact.  Probable old fracture deformity of the left inferior pubic ramus.  No displaced fractures of the pelvis.  Left hip demonstrates postoperative changes with plate and screw compression bolt fixation of the intertrochanteric region. Hardware components appear stable since previous study.  No evidence of acute fracture or subluxation of the left hip.  No focal bone lesion or bone erosion.  No radiopaque soft tissue foreign bodies.  IMPRESSION: Postoperative changes in the lumbosacral spine and left hip.  No acute bony abnormalities demonstrated.   Original Report Authenticated By: Burman Nieves, M.D.   No diagnosis found.  MDM  Xray neg for hardware problems or AVN. Pain medications, rest and PCP followup for likely hip bursitis. Doubt occult fracture, septic joint.  Charles B. Bernette Mayers, MD 02/09/13 [phone]

## 2013-02-09 NOTE — ED Notes (Addendum)
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

## 2014-04-26 ENCOUNTER — Emergency Department: Payer: Self-pay | Admitting: Emergency Medicine

## 2014-04-27 LAB — CBC
HCT: 41.8 % (ref 40.0–52.0)
HGB: 14.2 g/dL (ref 13.0–18.0)
MCH: 31.6 pg (ref 26.0–34.0)
MCHC: 34 g/dL (ref 32.0–36.0)
MCV: 93 fL (ref 80–100)
Platelet: 185 10*3/uL (ref 150–440)
RBC: 4.49 10*6/uL (ref 4.40–5.90)
RDW: 13.3 % (ref 11.5–14.5)
WBC: 7.2 10*3/uL (ref 3.8–10.6)

## 2014-04-27 LAB — BASIC METABOLIC PANEL
ANION GAP: 6 — AB (ref 7–16)
BUN: 16 mg/dL (ref 7–18)
Calcium, Total: 8.3 mg/dL — ABNORMAL LOW (ref 8.5–10.1)
Chloride: 111 mmol/L — ABNORMAL HIGH (ref 98–107)
Co2: 24 mmol/L (ref 21–32)
Creatinine: 1.11 mg/dL (ref 0.60–1.30)
EGFR (African American): >60
EGFR (Non-African Amer.): >60
GLUCOSE: 123 mg/dL — AB (ref 65–99)
Osmolality: 284 (ref 275–301)
Potassium: 3.2 mmol/L — ABNORMAL LOW (ref 3.5–5.1)
Sodium: 141 mmol/L (ref 136–145)

## 2014-04-27 LAB — TROPONIN I

## 2014-09-06 ENCOUNTER — Emergency Department: Payer: Self-pay | Admitting: Emergency Medicine

## 2014-09-06 LAB — CBC WITH DIFFERENTIAL/PLATELET
BASOS PCT: 0.7 %
Basophil #: 0 10*3/uL (ref 0.0–0.1)
EOS ABS: 0 10*3/uL (ref 0.0–0.7)
EOS PCT: 0.7 %
HCT: 47.4 % (ref 40.0–52.0)
HGB: 16 g/dL (ref 13.0–18.0)
LYMPHS ABS: 0.7 10*3/uL — AB (ref 1.0–3.6)
Lymphocyte %: 10.2 %
MCH: 31.6 pg (ref 26.0–34.0)
MCHC: 33.7 g/dL (ref 32.0–36.0)
MCV: 94 fL (ref 80–100)
MONO ABS: 0.4 x10 3/mm (ref 0.2–1.0)
Monocyte %: 6.3 %
Neutrophil #: 5.7 10*3/uL (ref 1.4–6.5)
Neutrophil %: 82.1 %
PLATELETS: 167 10*3/uL (ref 150–440)
RBC: 5.06 10*6/uL (ref 4.40–5.90)
RDW: 13.4 % (ref 11.5–14.5)
WBC: 6.9 10*3/uL (ref 3.8–10.6)

## 2014-09-06 LAB — COMPREHENSIVE METABOLIC PANEL
ALBUMIN: 3.8 g/dL (ref 3.4–5.0)
ALT: 181 U/L — AB (ref 14–63)
AST: 114 U/L — AB (ref 15–37)
Alkaline Phosphatase: 78 U/L (ref 46–116)
Anion Gap: 10 (ref 7–16)
BUN: 14 mg/dL (ref 7–18)
Bilirubin,Total: 0.4 mg/dL (ref 0.2–1.0)
CHLORIDE: 110 mmol/L — AB (ref 98–107)
Calcium, Total: 8.5 mg/dL (ref 8.5–10.1)
Co2: 20 mmol/L — ABNORMAL LOW (ref 21–32)
Creatinine: 1.24 mg/dL (ref 0.60–1.30)
EGFR (Non-African Amer.): >60
Glucose: 142 mg/dL — ABNORMAL HIGH (ref 65–99)
Osmolality: 282 (ref 275–301)
Potassium: 3.4 mmol/L — ABNORMAL LOW (ref 3.5–5.1)
Sodium: 140 mmol/L (ref 136–145)
TOTAL PROTEIN: 7.8 g/dL (ref 6.4–8.2)

## 2014-09-06 LAB — LIPASE, BLOOD: Lipase: 148 U/L (ref 73–393)

## 2014-09-06 LAB — CLOSTRIDIUM DIFFICILE(ARMC)

## 2014-09-06 LAB — TROPONIN I

## 2014-09-08 LAB — STOOL CULTURE

## 2014-11-15 DIAGNOSIS — G8929 Other chronic pain: Secondary | ICD-10-CM | POA: Insufficient documentation

## 2014-11-15 DIAGNOSIS — E669 Obesity, unspecified: Secondary | ICD-10-CM | POA: Insufficient documentation

## 2014-11-15 DIAGNOSIS — K219 Gastro-esophageal reflux disease without esophagitis: Secondary | ICD-10-CM | POA: Insufficient documentation

## 2014-11-15 DIAGNOSIS — E291 Testicular hypofunction: Secondary | ICD-10-CM | POA: Insufficient documentation

## 2014-11-15 DIAGNOSIS — M545 Low back pain, unspecified: Secondary | ICD-10-CM | POA: Insufficient documentation

## 2014-11-15 DIAGNOSIS — G4730 Sleep apnea, unspecified: Secondary | ICD-10-CM | POA: Insufficient documentation

## 2014-11-15 DIAGNOSIS — I129 Hypertensive chronic kidney disease with stage 1 through stage 4 chronic kidney disease, or unspecified chronic kidney disease: Secondary | ICD-10-CM | POA: Insufficient documentation

## 2014-11-15 DIAGNOSIS — G629 Polyneuropathy, unspecified: Secondary | ICD-10-CM | POA: Insufficient documentation

## 2014-11-15 DIAGNOSIS — F319 Bipolar disorder, unspecified: Secondary | ICD-10-CM | POA: Insufficient documentation

## 2014-11-15 DIAGNOSIS — G43109 Migraine with aura, not intractable, without status migrainosus: Secondary | ICD-10-CM | POA: Insufficient documentation

## 2014-11-15 DIAGNOSIS — E785 Hyperlipidemia, unspecified: Secondary | ICD-10-CM | POA: Insufficient documentation

## 2015-01-21 ENCOUNTER — Other Ambulatory Visit: Payer: Self-pay | Admitting: Family Medicine

## 2015-02-03 ENCOUNTER — Ambulatory Visit (INDEPENDENT_AMBULATORY_CARE_PROVIDER_SITE_OTHER): Payer: Medicaid Other | Admitting: Family Medicine

## 2015-02-03 ENCOUNTER — Telehealth: Payer: Self-pay

## 2015-02-03 ENCOUNTER — Encounter: Payer: Self-pay | Admitting: Family Medicine

## 2015-02-03 VITALS — BP 142/95 | HR 102 | Ht 70.5 in | Wt 259.8 lb

## 2015-02-03 DIAGNOSIS — I129 Hypertensive chronic kidney disease with stage 1 through stage 4 chronic kidney disease, or unspecified chronic kidney disease: Secondary | ICD-10-CM

## 2015-02-03 DIAGNOSIS — E559 Vitamin D deficiency, unspecified: Secondary | ICD-10-CM

## 2015-02-03 DIAGNOSIS — E785 Hyperlipidemia, unspecified: Secondary | ICD-10-CM

## 2015-02-03 DIAGNOSIS — R7301 Impaired fasting glucose: Secondary | ICD-10-CM | POA: Diagnosis not present

## 2015-02-03 DIAGNOSIS — G43109 Migraine with aura, not intractable, without status migrainosus: Secondary | ICD-10-CM | POA: Diagnosis not present

## 2015-02-03 DIAGNOSIS — K219 Gastro-esophageal reflux disease without esophagitis: Secondary | ICD-10-CM

## 2015-02-03 DIAGNOSIS — E669 Obesity, unspecified: Secondary | ICD-10-CM

## 2015-02-03 DIAGNOSIS — I10 Essential (primary) hypertension: Secondary | ICD-10-CM

## 2015-02-03 DIAGNOSIS — Z125 Encounter for screening for malignant neoplasm of prostate: Secondary | ICD-10-CM

## 2015-02-03 DIAGNOSIS — D485 Neoplasm of uncertain behavior of skin: Secondary | ICD-10-CM

## 2015-02-03 DIAGNOSIS — R61 Generalized hyperhidrosis: Secondary | ICD-10-CM

## 2015-02-03 LAB — MICROALBUMIN, URINE WAIVED
Creatinine, Urine Waived: 200 mg/dL (ref 10–300)
MICROALB, UR WAIVED: 30 mg/L — AB (ref 0–19)

## 2015-02-03 MED ORDER — GABAPENTIN 300 MG PO CAPS: 900.0000 mg | ORAL_CAPSULE | Freq: Three times a day (TID) | ORAL | Status: DC

## 2015-02-03 MED ORDER — METOPROLOL TARTRATE 25 MG PO TABS: 25.0000 mg | ORAL_TABLET | Freq: Every day | ORAL | Status: DC

## 2015-02-03 MED ORDER — LANSOPRAZOLE 30 MG PO CPDR: 30.0000 mg | DELAYED_RELEASE_CAPSULE | Freq: Two times a day (BID) | ORAL | Status: DC

## 2015-02-03 MED ORDER — SIMVASTATIN 20 MG PO TABS: 20.0000 mg | ORAL_TABLET | Freq: Every day | ORAL | Status: DC

## 2015-02-03 NOTE — Patient Instructions (Signed)
Diaphoresis Sweating is controlled by our nervous system. Sweat glands are found in the skin throughout the body. They exist in higher numbers in the skin of the hands, feet, armpits, and the genital region. Sweating occurs normally when the temperature of your body goes up. Diaphoresis means profuse sweating or perspiration due to an underlying medical condition or an external factor (such as medicines). Hyperhidrosis means excessive sweating that is not usually due to an underlying medical condition, on areas such as the palms, soles, or armpits. Other areas of the body may also be affected. Hyperhidrosis usually begins in childhood or early adolescence. It increases in severity through puberty and into adulthood. Sweaty palms are the most common problem and the most bothersome to people who have hyperhidrosis. CAUSES  Sweating is normally seen with exercise or being in hot surroundings. Not sweating in these conditions would be harmful. Stressful situations can also cause sweating. In some people, stimulation of the sweat glands during stress is overactive. Talking to strangers or shaking someone's hand can produce profuse sweating. Causes of sweating include:  Emotional upset.  Low blood pressure.  Low blood sugar.  Heart problems.  Low blood cell counts.  Certain pain relieving medicines.  Exercise.  Alcohol.  Infection.  Caffeine.  Spicy foods.  Hot flashes.  Overactive thyroid.  Illegal drug use, such as cocaine and amphetamine.  Use of medicines that stimulate parts of the nervous system.  A tumor (pheochromocytoma).  Withdrawal from some medicines or alcohol. DIAGNOSIS  Your caregiver needs to be consulted to make sure excessive sweating is not caused by another condition. Further testing may need to be done. TREATMENT   When hyperhidrosis is caused by another condition, that condition should be treated.  If menopause is the cause, you may wish to talk to your  caregiver about estrogen replacement.  If the hyperhidrosis is a natural happening of the way your body works, certain antiperspirants may help.  If medicines do not work, injections of botulinum toxin type A are sometimes used for underarm sweating.  Your caregiver can usually help you with this problem. Document Released: 02/12/2005 Document Revised: 10/15/2011 Document Reviewed: 08/30/2008 [facility] Patient Information 2015 [place], [place]. This information is not intended to replace advice given to you by your health care provider. Make sure you discuss any questions you have with your health care provider. DASH Eating Plan DASH stands for "Dietary Approaches to Stop Hypertension." The DASH eating plan is a healthy eating plan that has been shown to reduce high blood pressure (hypertension). Additional health benefits may include reducing the risk of type 2 diabetes mellitus, heart disease, and stroke. The DASH eating plan may also help with weight loss. WHAT DO I NEED TO KNOW ABOUT THE DASH EATING PLAN? For the DASH eating plan, you will follow these general guidelines:  Choose foods with a percent daily value for sodium of less than 5% (as listed on the food label).  Use salt-free seasonings or herbs instead of table salt or sea salt.  Check with your health care provider or pharmacist before using salt substitutes.  Eat lower-sodium products, often labeled as "lower sodium" or "no salt added."  Eat fresh foods.  Eat more vegetables, fruits, and low-fat dairy products.  Choose whole grains. Look for the word "whole" as the first word in the ingredient list.  Choose fish and skinless chicken or [place] more often than red meat. Limit fish, poultry, and meat to 6 oz (170 g) each day.  Limit  sweets, desserts, sugars, and sugary drinks.  Choose heart-healthy fats.  Limit cheese to 1 oz (28 g) per day.  Eat more home-cooked food and less restaurant, buffet, and fast food.  Limit  fried foods.  Cook foods using methods other than frying.  Limit canned vegetables. If you do use them, rinse them well to decrease the sodium.  When eating at a restaurant, ask that your food be prepared with less salt, or no salt if possible. WHAT FOODS CAN I EAT? Seek help from a dietitian for individual calorie needs. Grains Whole grain or whole wheat bread. Brown rice. Whole grain or whole wheat pasta. Quinoa, bulgur, and whole grain cereals. Low-sodium cereals. Corn or whole wheat flour tortillas. Whole grain cornbread. Whole grain crackers. Low-sodium crackers. Vegetables Fresh or frozen vegetables (raw, steamed, roasted, or grilled). Low-sodium or reduced-sodium tomato and vegetable juices. Low-sodium or reduced-sodium tomato sauce and paste. Low-sodium or reduced-sodium canned vegetables.  Fruits All fresh, canned (in natural juice), or frozen fruits. Meat and Other Protein Products Ground beef (85% or leaner), grass-fed beef, or beef trimmed of fat. Skinless chicken or [place]. Ground chicken or [place]. Pork trimmed of fat. All fish and seafood. Eggs. Dried beans, peas, or lentils. Unsalted nuts and seeds. Unsalted canned beans. Dairy Low-fat dairy products, such as skim or 1% milk, 2% or reduced-fat cheeses, low-fat ricotta or cottage cheese, or plain low-fat yogurt. Low-sodium or reduced-sodium cheeses. Fats and Oils Tub margarines without trans fats. Light or reduced-fat mayonnaise and salad dressings (reduced sodium). Avocado. Safflower, olive, or canola oils. Natural peanut or almond butter. Other Unsalted popcorn and pretzels. The items listed above may not be a complete list of recommended foods or beverages. Contact your dietitian for more options. WHAT FOODS ARE NOT RECOMMENDED? Grains White bread. White pasta. White rice. Refined cornbread. Bagels and croissants. Crackers that contain trans fat. Vegetables Creamed or fried vegetables. Vegetables in a cheese sauce.  Regular canned vegetables. Regular canned tomato sauce and paste. Regular tomato and vegetable juices. Fruits Dried fruits. Canned fruit in light or heavy syrup. Fruit juice. Meat and Other Protein Products Fatty cuts of meat. Ribs, chicken wings, bacon, sausage, bologna, salami, chitterlings, fatback, hot dogs, bratwurst, and packaged luncheon meats. Salted nuts and seeds. Canned beans with salt. Dairy Whole or 2% milk, cream, half-and-half, and cream cheese. Whole-fat or sweetened yogurt. Full-fat cheeses or blue cheese. Nondairy creamers and whipped toppings. Processed cheese, cheese spreads, or cheese curds. Condiments Onion and garlic salt, seasoned salt, table salt, and sea salt. Canned and packaged gravies. Worcestershire sauce. Tartar sauce. Barbecue sauce. Teriyaki sauce. Soy sauce, including reduced sodium. Steak sauce. Fish sauce. Oyster sauce. Cocktail sauce. Horseradish. Ketchup and mustard. Meat flavorings and tenderizers. Bouillon cubes. Hot sauce. Tabasco sauce. Marinades. Taco seasonings. Relishes. Fats and Oils Butter, stick margarine, lard, shortening, ghee, and bacon fat. Coconut, palm kernel, or palm oils. Regular salad dressings. Other Pickles and olives. Salted popcorn and pretzels. The items listed above may not be a complete list of foods and beverages to avoid. Contact your dietitian for more information. WHERE CAN I FIND MORE INFORMATION? National Heart, Lung, and Blood Institute: travelstabloid.com Document Released: 07/12/2011 Document Revised: 12/07/2013 Document Reviewed: 05/27/2013 [facility] Patient Information 2015 [place], [place]. This information is not intended to replace advice given to you by your health care provider. Make sure you discuss any questions you have with your health care provider.

## 2015-02-03 NOTE — Telephone Encounter (Signed)
Patient called, his Lansoprazole needs a prior authorization.  [facility] [place] Please notify patient when this has been processed.

## 2015-02-03 NOTE — Progress Notes (Signed)
BP 142/95 mmHg  Pulse 102  Ht 5' 10.5" (1.791 m)  Wt 259 lb 12.8 oz (117.845 kg)  BMI 36.74 kg/m2  SpO2 94%   Subjective:    Patient ID: David Prince, male    DOB: 1965-12-08, 49 y.o.   MRN: [id number]  HPI: David Prince is a 49 y.o. male  Chief Complaint  Patient presents with  . Skin Problem    lesion on left upper chest, that is getting larger patient denies any pain  . Hypertension  . Night Sweats   SKIN LESION- on his L chest, started growing not sure how long ago Duration: months Location: L chest Painful: no Itching: no Onset: sudden Context: bigger Associated signs and symptoms:  History of skin cancer: no History of precancerous skin lesions: no Family history of skin cancer: no  HYPERTENSION Hypertension status: uncontrolled has not been taking his medicine for several weeks because he ran out.  Satisfied with current treatment? no Duration of hypertension: chronic BP monitoring frequency:  not checking BP medication side effects:  no Aspirin: no Recurrent headaches: no Visual changes: no Palpitations: no Dyspnea: no Chest pain: no Lower extremity edema: no Dizzy/lightheaded: no  GERD- well controlled on the prevacid. Would like to get started on prevacid Rx.  Has been having night sweats for about the past year. Soaking his pillow. Not every night, but often. Nothing seems to make it better or worse. No new medications. Has been under a lot of stress. No other concerns or complaints at this time.   Relevant past medical, surgical, family and social history reviewed and updated as indicated. Interim medical history since our last visit reviewed. Allergies and medications reviewed and updated.  Review of Systems  Constitutional: Negative.   Respiratory: Negative.   Cardiovascular: Negative.   Skin: Negative.   Psychiatric/Behavioral: Negative.     Per HPI unless specifically indicated above     Objective:    BP 142/95 mmHg  Pulse  102  Ht 5' 10.5" (1.791 m)  Wt 259 lb 12.8 oz (117.845 kg)  BMI 36.74 kg/m2  SpO2 94%  Wt Readings from Last 3 Encounters:  02/03/15 259 lb 12.8 oz (117.845 kg)  11/11/14 255 lb (115.667 kg)  02/09/13 230 lb (104.327 kg)    Physical Exam  Constitutional: He is oriented to person, place, and time. He appears well-developed and well-nourished. No distress.  HENT:  Head: Normocephalic and atraumatic.  Right Ear: Hearing normal.  Left Ear: Hearing normal.  Nose: Nose normal.  Eyes: Conjunctivae and lids are normal. Right eye exhibits no discharge. Left eye exhibits no discharge. No scleral icterus.  Cardiovascular: Regular rhythm and normal heart sounds.  Tachycardia present.  Exam reveals no gallop and no friction rub.   No murmur heard. Pulmonary/Chest: Effort normal. No respiratory distress. He has no wheezes. He has no rales. He exhibits no tenderness.  Musculoskeletal: Normal range of motion.  Neurological: He is alert and oriented to person, place, and time.  Skin: Skin is warm, dry and intact. No rash noted. No erythema. No pallor.  1.5cm flesh colored raised papule on L chest  Psychiatric: He has a normal mood and affect. His speech is normal and behavior is normal. Judgment and thought content normal. Cognition and memory are normal.        Assessment & Plan:   Problem List Items Addressed This Visit      Cardiovascular and Mediastinum   Hypertension - Primary   Relevant Medications  metoprolol (LOPRESSOR) 25 MG tablet   simvastatin (ZOCOR) 20 MG tablet   Other Relevant Orders   Comprehensive metabolic panel   Hgb D9R w/o eAG   Microalbumin, Urine Waived   Vit D  25 hydroxy (rtn osteoporosis monitoring)   Migraine with aura   Relevant Medications   gabapentin (NEURONTIN) 300 MG capsule   metoprolol (LOPRESSOR) 25 MG tablet   simvastatin (ZOCOR) 20 MG tablet   Other Relevant Orders   Comprehensive metabolic panel   Hgb C1U w/o eAG   Vit D  25 hydroxy (rtn  osteoporosis monitoring)     Digestive   GERD (gastroesophageal reflux disease)   Relevant Medications   lansoprazole (PREVACID) 30 MG capsule   Other Relevant Orders   CBC With Differential/Platelet   Comprehensive metabolic panel   Hgb L8G w/o eAG   Vit D  25 hydroxy (rtn osteoporosis monitoring)     Other   Hyperlipidemia   Relevant Medications   metoprolol (LOPRESSOR) 25 MG tablet   simvastatin (ZOCOR) 20 MG tablet   Other Relevant Orders   Comprehensive metabolic panel   Hgb T3M w/o eAG   Lipid Panel w/o Chol/HDL Ratio   Vit D  25 hydroxy (rtn osteoporosis monitoring)   Obesity   Relevant Orders   Comprehensive metabolic panel   Hgb I6O w/o eAG   Vit D  25 hydroxy (rtn osteoporosis monitoring)    Other Visit Diagnoses    Night sweats        Relevant Orders    CBC With Differential/Platelet    Comprehensive metabolic panel    Hgb E3O w/o eAG    TSH    Vit D  25 hydroxy (rtn osteoporosis monitoring)    Neoplasm of uncertain behavior of skin        We will biopsy this ASAP. Will return for shave biopsy.     Relevant Orders    CBC With Differential/Platelet    Comprehensive metabolic panel    Hgb Z2Y w/o eAG    Vit D  25 hydroxy (rtn osteoporosis monitoring)    Screening for prostate cancer        Will screen today. Labs drawn.     Relevant Orders    Comprehensive metabolic panel    Hgb Q8G w/o eAG    PSA    Vit D  25 hydroxy (rtn osteoporosis monitoring)        Follow up plan: Return ASAP , for mole removal and BP check.

## 2015-02-04 ENCOUNTER — Encounter: Payer: Self-pay | Admitting: Family Medicine

## 2015-02-04 ENCOUNTER — Ambulatory Visit (INDEPENDENT_AMBULATORY_CARE_PROVIDER_SITE_OTHER): Payer: Medicaid Other | Admitting: Family Medicine

## 2015-02-04 VITALS — BP 129/94 | HR 103 | Temp 98.3°F | Wt 262.4 lb

## 2015-02-04 DIAGNOSIS — D485 Neoplasm of uncertain behavior of skin: Secondary | ICD-10-CM | POA: Diagnosis not present

## 2015-02-04 DIAGNOSIS — I129 Hypertensive chronic kidney disease with stage 1 through stage 4 chronic kidney disease, or unspecified chronic kidney disease: Secondary | ICD-10-CM | POA: Diagnosis not present

## 2015-02-04 DIAGNOSIS — E559 Vitamin D deficiency, unspecified: Secondary | ICD-10-CM | POA: Insufficient documentation

## 2015-02-04 DIAGNOSIS — R7301 Impaired fasting glucose: Secondary | ICD-10-CM | POA: Insufficient documentation

## 2015-02-04 LAB — CBC WITH DIFFERENTIAL/PLATELET
HEMOGLOBIN: 15.5 g/dL (ref 12.6–17.7)
Hematocrit: 46.4 % (ref 37.5–51.0)
LYMPHS: 25 %
Lymphocytes Absolute: 2 10*3/uL (ref 0.7–3.1)
MCH: 30.8 pg (ref 26.6–33.0)
MCHC: 33.4 g/dL (ref 31.5–35.7)
MCV: 92 fL (ref 79–97)
MID (Absolute): 0.4 10*3/uL (ref 0.1–1.6)
MID: 5 %
NEUTROS ABS: 5.6 10*3/uL (ref 1.4–7.0)
Neutrophils: 70 %
PLATELETS: 229 10*3/uL (ref 150–379)
RBC: 5.03 x10E6/uL (ref 4.14–5.80)
RDW: 13.5 % (ref 12.3–15.4)
WBC: 8 10*3/uL (ref 3.4–10.8)

## 2015-02-04 LAB — COMPREHENSIVE METABOLIC PANEL
ALK PHOS: 65 IU/L (ref 39–117)
ALT: 41 IU/L (ref 0–44)
AST: 33 IU/L (ref 0–40)
Albumin/Globulin Ratio: 1.9 (ref 1.1–2.5)
Albumin: 4.6 g/dL (ref 3.5–5.5)
BUN/Creatinine Ratio: 18 (ref 9–20)
BUN: 16 mg/dL (ref 6–24)
Bilirubin Total: 0.3 mg/dL (ref 0.0–1.2)
CO2: 24 mmol/L (ref 18–29)
CREATININE: 0.87 mg/dL (ref 0.76–1.27)
Calcium: 9.3 mg/dL (ref 8.7–10.2)
Chloride: 101 mmol/L (ref 97–108)
GFR, EST AFRICAN AMERICAN: 117 mL/min/{1.73_m2} (ref >59)
GFR, EST NON AFRICAN AMERICAN: 101 mL/min/{1.73_m2} (ref >59)
GLUCOSE: 116 mg/dL — AB (ref 65–99)
Globulin, Total: 2.4 g/dL (ref 1.5–4.5)
POTASSIUM: 4.6 mmol/L (ref 3.5–5.2)
SODIUM: 144 mmol/L (ref 134–144)
Total Protein: 7 g/dL (ref 6.0–8.5)

## 2015-02-04 LAB — LIPID PANEL W/O CHOL/HDL RATIO
CHOLESTEROL TOTAL: 232 mg/dL — AB (ref 100–199)
HDL: 58 mg/dL (ref >39)
LDL CALC: 106 mg/dL — AB (ref 0–99)
TRIGLYCERIDES: 339 mg/dL — AB (ref 0–149)
VLDL CHOLESTEROL CAL: 68 mg/dL — AB (ref 5–40)

## 2015-02-04 LAB — TSH: TSH: 2.86 u[IU]/mL (ref 0.450–4.500)

## 2015-02-04 LAB — VITAMIN D 25 HYDROXY (VIT D DEFICIENCY, FRACTURES): Vit D, 25-Hydroxy: 23 ng/mL — ABNORMAL LOW (ref 30.0–100.0)

## 2015-02-04 LAB — HGB A1C W/O EAG: HEMOGLOBIN A1C: 5.9 % — AB (ref 4.8–5.6)

## 2015-02-04 LAB — PSA: Prostate Specific Ag, Serum: 0.6 ng/mL (ref 0.0–4.0)

## 2015-02-04 MED ORDER — OMEPRAZOLE 40 MG PO CPDR: 40.0000 mg | DELAYED_RELEASE_CAPSULE | Freq: Every day | ORAL | Status: DC

## 2015-02-04 NOTE — Patient Instructions (Signed)
Dressing Change °A dressing is a material placed over wounds. It keeps the wound clean, dry, and protected from further injury. This provides an environment that favors wound healing.  °BEFORE YOU BEGIN °· Get your supplies together. Things you may need include: °¨ Saline solution. °¨ Flexible gauze dressing. °¨ Medicated cream. °¨ Tape. °¨ Gloves. °¨ Abdominal dressing pads. °¨ Gauze squares. °¨ Plastic bags. °· Take pain medicine 30 minutes before the dressing change if you need it. °· Take a shower before you do the first dressing change of the day. Use plastic wrap or a plastic bag to prevent the dressing from getting wet. °REMOVING YOUR OLD DRESSING  °· Wash your hands with soap and water. Dry your hands with a clean towel. °· Put on your gloves. °· Remove any tape. °· Carefully remove the old dressing. If the dressing sticks, you may dampen it with warm water to loosen it, or follow your caregiver's specific directions. °· Remove any gauze or packing tape that is in your wound. °· Take off your gloves. °· Put the gloves, tape, gauze, or any packing tape into a plastic bag. °CHANGING YOUR DRESSING °· Open the supplies. °· Take the cap off the saline solution. °· Open the gauze package so that the gauze remains on the inside of the package. °· Put on your gloves. °· Clean your wound as told by your caregiver. °· If you have been told to keep your wound dry, follow those instructions. °· Your caregiver may tell you to do one or more of the following: °¨ Pick up the gauze. Pour the saline solution over the gauze. Squeeze out the extra saline solution. °¨ Put medicated cream or other medicine on your wound if you have been told to do so. °¨ Put the solution soaked gauze only in your wound, not on the skin around it. °¨ Pack your wound loosely or as told by your caregiver. °¨ Put dry gauze on your wound. °¨ Put abdominal dressing pads over the dry gauze if your wet gauze soaks through. °· Tape the abdominal dressing  pads in place so they will not fall off. Do not wrap the tape completely around the affected part (arm, leg, abdomen). °· Wrap the dressing pads with a flexible gauze dressing to secure it in place. °· Take off your gloves. Put them in the plastic bag with the old dressing. Tie the bag shut and throw it away. °· Keep the dressing clean and dry until your next dressing change. °· Wash your hands. °SEEK MEDICAL CARE IF: °· Your skin around the wound looks red. °· Your wound feels more tender or sore. °· You see pus in the wound. °· Your wound smells bad. °· You have a fever. °· Your skin around the wound has a rash that itches and burns. °· You see black or yellow skin in your wound that was not there before. °· You feel nauseous, throw up, and feel very tired. °Document Released: 08/30/2004 Document Revised: 10/15/2011 Document Reviewed: 06/04/2011 °[employer]® Patient Information ©2015 [employer]. This information is not intended to replace advice given to you by your health care provider. Make sure you discuss any questions you have with your health care provider. ° °

## 2015-02-04 NOTE — Addendum Note (Signed)
Addended by: Valerie Roys on: 02/04/2015 08:20 AM   Modules accepted: Miquel Dunn

## 2015-02-04 NOTE — Telephone Encounter (Signed)
Discard Previous message, Dr.Johnson gave him a new medication.

## 2015-02-04 NOTE — Assessment & Plan Note (Signed)
Much better on his medicine. Continue current regimen. Continue to monitor.

## 2015-02-04 NOTE — Progress Notes (Signed)
BP 129/94 mmHg  Pulse 103  Temp(Src) 98.3 F (36.8 C)  Wt 262 lb 6.4 oz (119.024 kg)  SpO2 96%   Subjective:    Patient ID: David Prince, male    DOB: 10-27-1965, 49 y.o.   MRN: [id number]  HPI: Herschell Virani is a 49 y.o. male  Chief Complaint  Patient presents with  . skin lesion    here to have mole removed on chest  . Gastrophageal Reflux    please change his medication to something that will be covered   HYPERTENSION Hypertension status: controlled  Satisfied with current treatment? yes Duration of hypertension: chronic BP monitoring frequency:  not checking BP medication side effects:  no Medication compliance: fair compliance Aspirin: no Recurrent headaches: no Visual changes: no Palpitations: no Dyspnea: no Chest pain: no Lower extremity edema: no Dizzy/lightheaded: no  Relevant past medical, surgical, family and social history reviewed and updated as indicated. Interim medical history since our last visit reviewed. Allergies and medications reviewed and updated.  Review of Systems  Constitutional: Negative.   Respiratory: Negative.   Cardiovascular: Negative.   Skin: Negative.   Psychiatric/Behavioral: Negative.    Per HPI unless specifically indicated above    Objective:    BP 129/94 mmHg  Pulse 103  Temp(Src) 98.3 F (36.8 C)  Wt 262 lb 6.4 oz (119.024 kg)  SpO2 96%  Wt Readings from Last 3 Encounters:  02/04/15 262 lb 6.4 oz (119.024 kg)  02/03/15 259 lb 12.8 oz (117.845 kg)  11/11/14 255 lb (115.667 kg)    Physical Exam  Constitutional: He is oriented to person, place, and time. He appears well-developed and well-nourished. No distress.  HENT:  Head: Normocephalic and atraumatic.  Right Ear: Hearing normal.  Left Ear: Hearing normal.  Nose: Nose normal.  Eyes: Conjunctivae and lids are normal. Right eye exhibits no discharge. Left eye exhibits no discharge. No scleral icterus.  Cardiovascular: Normal rate, regular rhythm, normal  heart sounds and intact distal pulses.  Exam reveals no gallop and no friction rub.   No murmur heard. Pulmonary/Chest: Effort normal and breath sounds normal. No respiratory distress. He has no wheezes. He has no rales. He exhibits no tenderness.  Musculoskeletal: Normal range of motion.  Neurological: He is alert and oriented to person, place, and time.  Skin: Skin is warm, dry and intact. No rash noted. No erythema. No pallor.  1.5cm waxy lesion on anterior L chest  Psychiatric: He has a normal mood and affect. His speech is normal and behavior is normal. Judgment and thought content normal. Cognition and memory are normal.   Skin Procedure  Procedure: Informed consent given.  Sterile prep of the area.  Area infiltrated with lidocaine with epinephrine.  Using a surgical blade, part of the upper dermis shaved off and sent  for pathology.  Area cauterized. Pt ed on scarring  Diagnosis:   ICD-9-CM ICD-10-CM   1. Neoplasm of uncertain behavior of skin 238.2 D48.5 Pathology Report     CANCELED: Dermatology pathology   Biopsy done today as discussed above. Await pathology.   2. Benign hypertensive renal disease 403.10 I12.9     Lesion Location/Size: Physician: MJ Consent:  Risks, benefits, and alternative treatments discussed and all questions were answered.  Patient elected to proceed and verbal consent obtained.  Description: Area prepped and draped using semi-sterile technique. Area locally anesthetized using 2 cc's of lidocaine 1% with epi. Shave biopsy of lesion performed using a #15 blade scalpel.  Adequate hemostastis achieved  using electrocautery. Complications: None Estimated Blood lost: minimal Pathology:  sent Post Procedure Instructions: Wound care instructions discussed and patient was instructed to keep area clean and dry.  Signs and symptoms of infection discussed, patient agrees to contact the office ASAP should they occur.  Dressing change recommended daily.Signs and  symptoms of infection discussed and patient encouraged to contact the clinic ASAP for concerns.     Assessment & Plan:   Problem List Items Addressed This Visit      Genitourinary   Benign hypertensive renal disease    Much better on his medicine. Continue current regimen. Continue to monitor.        Other Visit Diagnoses    Neoplasm of uncertain behavior of skin    -  Primary    Biopsy done today as discussed above. Await pathology.     Relevant Orders    Pathology Report        Follow up plan: Return in about 4 weeks (around 03/04/2015).

## 2015-02-08 ENCOUNTER — Telehealth: Payer: Self-pay

## 2015-02-08 NOTE — Telephone Encounter (Signed)
Called patient wanting lab results, I returned his call to let him know that the pathology was not back yet, and she had discussed all the other results with him.

## 2015-02-11 ENCOUNTER — Encounter: Payer: Self-pay | Admitting: Family Medicine

## 2015-02-11 ENCOUNTER — Telehealth: Payer: Self-pay | Admitting: Family Medicine

## 2015-02-11 LAB — PATHOLOGY

## 2015-02-11 NOTE — Telephone Encounter (Signed)
Patient notified of results via voice mail °

## 2015-02-11 NOTE — Telephone Encounter (Signed)
Please let Coley know that the mole was just an angry skin tag- no cancer. Nothing to worry about.

## 2015-04-22 ENCOUNTER — Ambulatory Visit (INDEPENDENT_AMBULATORY_CARE_PROVIDER_SITE_OTHER): Payer: Medicaid Other | Admitting: Family Medicine

## 2015-04-22 ENCOUNTER — Encounter: Payer: Self-pay | Admitting: Family Medicine

## 2015-04-22 VITALS — BP 132/97 | HR 97 | Temp 98.6°F | Ht 70.1 in | Wt 270.0 lb

## 2015-04-22 DIAGNOSIS — E785 Hyperlipidemia, unspecified: Secondary | ICD-10-CM

## 2015-04-22 DIAGNOSIS — G629 Polyneuropathy, unspecified: Secondary | ICD-10-CM

## 2015-04-22 DIAGNOSIS — I129 Hypertensive chronic kidney disease with stage 1 through stage 4 chronic kidney disease, or unspecified chronic kidney disease: Secondary | ICD-10-CM

## 2015-04-22 DIAGNOSIS — K219 Gastro-esophageal reflux disease without esophagitis: Secondary | ICD-10-CM

## 2015-04-22 MED ORDER — METOPROLOL TARTRATE 25 MG PO TABS: 25.0000 mg | ORAL_TABLET | Freq: Every day | ORAL | Status: AC

## 2015-04-22 MED ORDER — OMEPRAZOLE 40 MG PO CPDR: 40.0000 mg | DELAYED_RELEASE_CAPSULE | Freq: Every day | ORAL | Status: AC

## 2015-04-22 MED ORDER — GABAPENTIN 300 MG PO CAPS: 900.0000 mg | ORAL_CAPSULE | Freq: Three times a day (TID) | ORAL | Status: AC

## 2015-04-22 NOTE — Progress Notes (Signed)
BP 132/97 mmHg  Pulse 97  Temp(Src) 98.6 F (37 C)  Ht 5' 10.1" (1.781 m)  Wt 270 lb (122.471 kg)  BMI 38.61 kg/m2  SpO2 96%   Subjective:    Patient ID: David Prince, male    DOB: 07/18/1966, 49 y.o.   MRN: [id number]  HPI: David Prince is a 49 y.o. male  Chief Complaint  Patient presents with  . Hypertension  . Hyperlipidemia  . Gastrophageal Reflux  . Migraine  . Pain    uses gabapentin   Lost his job, so will be moving out [place] to [place]. Needs refills of his medicine until he is able to establish with a new PCP there.   CHRONIC PAIN- well controlled on his gabapentin. Felling well. No side effects. Would like to continue with it.   Pain control status: controlled Duration: chronic Location: Back and legs Quality: burning and shooting Current Pain Level: mild Previous Pain Level: severe Breakthrough pain: no Non-narcotic analgesic meds: yes  HYPERTENSION / HYPERLIPIDEMIA Satisfied with current treatment? yes Duration of hypertension: chronic BP monitoring frequency: not checking BP medication side effects: no Duration of hyperlipidemia: chronic Cholesterol medication side effects: no Cholesterol supplements: none Medication compliance: good compliance Aspirin: no Recent stressors: yes Recurrent headaches: no Visual changes: no Palpitations: no Dyspnea: no Chest pain: no Lower extremity edema: no Dizzy/lightheaded: no  HEARTBURN Duration:  chronic Onset: gradual Status: controlled  Dysphagia: no Odynophagia:  no Nausea: no Vomiting: no Hematemesis: no Blood in stool: no Constitutional: no  Relevant past medical, surgical, family and social history reviewed and updated as indicated. Interim medical history since our last visit reviewed. Allergies and medications reviewed and updated.  Review of Systems  Constitutional: Negative.   Respiratory: Negative.   Cardiovascular: Negative.   Gastrointestinal: Negative.    Musculoskeletal: Negative.   Skin: Negative.   Psychiatric/Behavioral: Negative.     Per HPI unless specifically indicated above     Objective:    BP 132/97 mmHg  Pulse 97  Temp(Src) 98.6 F (37 C)  Ht 5' 10.1" (1.781 m)  Wt 270 lb (122.471 kg)  BMI 38.61 kg/m2  SpO2 96%  Wt Readings from Last 3 Encounters:  04/22/15 270 lb (122.471 kg)  02/04/15 262 lb 6.4 oz (119.024 kg)  02/03/15 259 lb 12.8 oz (117.845 kg)    Physical Exam  Constitutional: He is oriented to person, place, and time. He appears well-developed and well-nourished. No distress.  HENT:  Head: Normocephalic and atraumatic.  Right Ear: Hearing normal.  Left Ear: Hearing normal.  Nose: Nose normal.  Eyes: Conjunctivae and lids are normal. Right eye exhibits no discharge. Left eye exhibits no discharge. No scleral icterus.  Cardiovascular: Normal rate, regular rhythm, normal heart sounds and intact distal pulses.  Exam reveals no gallop and no friction rub.   No murmur heard. Pulmonary/Chest: Effort normal and breath sounds normal. No respiratory distress. He has no wheezes. He has no rales. He exhibits no tenderness.  Abdominal: Soft. Bowel sounds are normal. He exhibits no distension and no mass. There is no tenderness. There is no rebound and no guarding.  Musculoskeletal: Normal range of motion.  Neurological: He is alert and oriented to person, place, and time.  Skin: Skin is warm, dry and intact. No rash noted. No erythema. No pallor.  Psychiatric: He has a normal mood and affect. His speech is normal and behavior is normal. Judgment and thought content normal. Cognition and memory are normal.  Nursing note and vitals  reviewed.     Assessment & Plan:   Problem List Items Addressed This Visit      Digestive   GERD (gastroesophageal reflux disease)    Under good control on his current regimen. Continue current regimen. 6 month supply given until he can establish with new PCP in [place].       Relevant Medications   omeprazole (PRILOSEC) 40 MG capsule     Nervous and Auditory   Neuropathy    Under good control on his current regimen. Continue current regimen. 6 month supply given until he can establish with new PCP in [place].        Genitourinary   Benign hypertensive renal disease - Primary    Under good control on his current regimen. Continue current regimen. 6 month supply given until he can establish with new PCP in [place]. CMP checked today.       Relevant Orders   Comprehensive metabolic panel     Other   Hyperlipidemia    Not under good control. Lipemic on test today. Await results and adjust medication as needed- follow up with new PCP in [place].       Relevant Medications   metoprolol tartrate (LOPRESSOR) 25 MG tablet   Other Relevant Orders   Lipid Panel Piccolo, Waived   Comprehensive metabolic panel       Follow up plan: Return if symptoms worsen or fail to improve.

## 2015-04-22 NOTE — Assessment & Plan Note (Signed)
Under good control on his current regimen. Continue current regimen. 6 month supply given until he can establish with new PCP in [place].

## 2015-04-22 NOTE — Assessment & Plan Note (Signed)
Under good control on his current regimen. Continue current regimen. 6 month supply given until he can establish with new PCP in [place].

## 2015-04-22 NOTE — Assessment & Plan Note (Signed)
Not under good control. Lipemic on test today. Await results and adjust medication as needed- follow up with new PCP in [place].

## 2015-04-22 NOTE — Assessment & Plan Note (Signed)
Under good control on his current regimen. Continue current regimen. 6 month supply given until he can establish with new PCP in [place]. CMP checked today.

## 2015-04-23 LAB — COMPREHENSIVE METABOLIC PANEL
ALBUMIN: 4.4 g/dL (ref 3.5–5.5)
ALK PHOS: 61 IU/L (ref 39–117)
ALT: 34 IU/L (ref 0–44)
AST: 23 IU/L (ref 0–40)
Albumin/Globulin Ratio: 1.8 (ref 1.1–2.5)
BILIRUBIN TOTAL: 0.3 mg/dL (ref 0.0–1.2)
BUN / CREAT RATIO: 17 (ref 9–20)
BUN: 14 mg/dL (ref 6–24)
CHLORIDE: 103 mmol/L (ref 97–108)
CO2: 22 mmol/L (ref 18–29)
Calcium: 9 mg/dL (ref 8.7–10.2)
Creatinine, Ser: 0.84 mg/dL (ref 0.76–1.27)
GFR calc Af Amer: 119 mL/min/{1.73_m2} (ref >59)
GFR calc non Af Amer: 103 mL/min/{1.73_m2} (ref >59)
GLUCOSE: 136 mg/dL — AB (ref 65–99)
Globulin, Total: 2.5 g/dL (ref 1.5–4.5)
Potassium: 4.8 mmol/L (ref 3.5–5.2)
Sodium: 142 mmol/L (ref 134–144)
Total Protein: 6.9 g/dL (ref 6.0–8.5)

## 2015-04-23 LAB — LIPID PANEL PICCOLO, WAIVED

## 2015-04-24 LAB — LIPID PANEL W/O CHOL/HDL RATIO
CHOLESTEROL TOTAL: 205 mg/dL — AB (ref 100–199)
HDL: 53 mg/dL (ref >39)
LDL Calculated: 73 mg/dL (ref 0–99)
TRIGLYCERIDES: 393 mg/dL — AB (ref 0–149)

## 2015-04-24 LAB — SPECIMEN STATUS REPORT

## 2015-04-25 ENCOUNTER — Telehealth: Payer: Self-pay | Admitting: Family Medicine

## 2015-04-25 MED ORDER — ATORVASTATIN CALCIUM 40 MG PO TABS: 40.0000 mg | ORAL_TABLET | Freq: Every day | ORAL | Status: AC

## 2015-04-25 NOTE — Telephone Encounter (Signed)
Cholesterol and triglycerides still high. Rx for atorvastatin sent to his pharmacy.

## 2015-05-12 ENCOUNTER — Telehealth: Payer: Self-pay | Admitting: Family Medicine

## 2015-05-12 MED ORDER — CYCLOBENZAPRINE HCL 10 MG PO TABS: 10.0000 mg | ORAL_TABLET | Freq: Three times a day (TID) | ORAL | Status: AC | PRN

## 2015-05-12 MED ORDER — IBUPROFEN 600 MG PO TABS: 600.0000 mg | ORAL_TABLET | Freq: Three times a day (TID) | ORAL | Status: AC | PRN

## 2015-05-12 NOTE — Telephone Encounter (Signed)
Called and spoke to [place]- Call got disconnected. Called back. They are in the middle of moving and he picked up a bag of rocks and threw his back out. Is wondering if something can be done. Called in muscle relaxer and ibuprofen. If not better in 2-3 days, advised him to be seen.

## 2015-05-12 NOTE — Telephone Encounter (Signed)
Pt called requests call back from Dr. Wynetta Emery ASAP. Thanks.

## 2015-05-12 NOTE — Telephone Encounter (Signed)
Routed to Dr Johnson.

## 2015-05-13 ENCOUNTER — Emergency Department
Admission: EM | Admit: 2015-05-13 | Discharge: 2015-05-13 | Discharge status: Against Medical Advice | Payer: Medicaid Other | Attending: Emergency Medicine | Admitting: Emergency Medicine

## 2015-05-13 ENCOUNTER — Emergency Department: Payer: Medicaid Other

## 2015-05-13 ENCOUNTER — Encounter: Payer: Self-pay | Admitting: *Deleted

## 2015-05-13 DIAGNOSIS — S3992XA Unspecified injury of lower back, initial encounter: Secondary | ICD-10-CM | POA: Diagnosis present

## 2015-05-13 DIAGNOSIS — Z87891 Personal history of nicotine dependence: Secondary | ICD-10-CM | POA: Insufficient documentation

## 2015-05-13 DIAGNOSIS — I10 Essential (primary) hypertension: Secondary | ICD-10-CM | POA: Insufficient documentation

## 2015-05-13 DIAGNOSIS — S39012A Strain of muscle, fascia and tendon of lower back, initial encounter: Secondary | ICD-10-CM | POA: Insufficient documentation

## 2015-05-13 DIAGNOSIS — Y9289 Other specified places as the place of occurrence of the external cause: Secondary | ICD-10-CM | POA: Insufficient documentation

## 2015-05-13 DIAGNOSIS — Y93F2 Activity, caregiving, lifting: Secondary | ICD-10-CM | POA: Diagnosis not present

## 2015-05-13 DIAGNOSIS — Y998 Other external cause status: Secondary | ICD-10-CM | POA: Insufficient documentation

## 2015-05-13 DIAGNOSIS — Z79899 Other long term (current) drug therapy: Secondary | ICD-10-CM | POA: Diagnosis not present

## 2015-05-13 DIAGNOSIS — W1839XA Other fall on same level, initial encounter: Secondary | ICD-10-CM | POA: Diagnosis not present

## 2015-05-13 DIAGNOSIS — M545 Low back pain: Secondary | ICD-10-CM | POA: Diagnosis not present

## 2015-05-13 MED ORDER — DIAZEPAM 5 MG/ML IJ SOLN
5.0000 mg | Freq: Once | INTRAMUSCULAR | Status: AC
  Administered 2015-05-13: 13:00:00 via INTRAMUSCULAR
  Filled 2015-05-13: qty 2

## 2015-05-13 MED ORDER — KETOROLAC TROMETHAMINE 60 MG/2ML IM SOLN
60.0000 mg | Freq: Once | INTRAMUSCULAR | Status: AC
  Administered 2015-05-13: 60 mg via INTRAMUSCULAR
  Filled 2015-05-13: qty 2

## 2015-05-13 NOTE — ED Notes (Signed)
Pt reports back pain since Saturday. Pt lifted bag of rocks for landscaping and felt back pull out. Hx of previous fusion. Now having pain in middle of back to top of left leg.

## 2015-05-13 NOTE — ED Notes (Signed)
Pt reports he was here earlier, had CT scan, but did not get results due to having to leave for a family emergency. Increase in lower back pain.

## 2015-05-13 NOTE — ED Notes (Signed)
5 MG VALIUM GIVEN IM.

## 2015-05-13 NOTE — Discharge Instructions (Signed)
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
## 2015-05-13 NOTE — ED Notes (Signed)
Went into room, patient noted to be gone.  All bathrooms checked and lobby checked.  Patient assumed left AMA.

## 2015-05-14 ENCOUNTER — Emergency Department
Admission: EM | Admit: 2015-05-14 | Discharge: 2015-05-14 | Discharge status: Against Medical Advice | Payer: Medicaid Other | Attending: Emergency Medicine | Admitting: Emergency Medicine

## 2015-05-14 ENCOUNTER — Encounter (HOSPITAL_BASED_OUTPATIENT_CLINIC_OR_DEPARTMENT_OTHER): Payer: Self-pay | Admitting: Emergency Medicine

## 2015-05-14 ENCOUNTER — Emergency Department (HOSPITAL_BASED_OUTPATIENT_CLINIC_OR_DEPARTMENT_OTHER)
Admission: EM | Admit: 2015-05-14 | Discharge: 2015-05-14 | Disposition: A | Discharge status: Home / Self Care | Payer: Medicaid Other | Attending: Emergency Medicine | Admitting: Emergency Medicine

## 2015-05-14 DIAGNOSIS — Z8701 Personal history of pneumonia (recurrent): Secondary | ICD-10-CM | POA: Insufficient documentation

## 2015-05-14 DIAGNOSIS — K219 Gastro-esophageal reflux disease without esophagitis: Secondary | ICD-10-CM | POA: Diagnosis not present

## 2015-05-14 DIAGNOSIS — Z79899 Other long term (current) drug therapy: Secondary | ICD-10-CM | POA: Diagnosis not present

## 2015-05-14 DIAGNOSIS — Z87891 Personal history of nicotine dependence: Secondary | ICD-10-CM | POA: Insufficient documentation

## 2015-05-14 DIAGNOSIS — M549 Dorsalgia, unspecified: Secondary | ICD-10-CM | POA: Diagnosis present

## 2015-05-14 DIAGNOSIS — G8929 Other chronic pain: Secondary | ICD-10-CM | POA: Diagnosis not present

## 2015-05-14 DIAGNOSIS — M545 Low back pain: Secondary | ICD-10-CM | POA: Insufficient documentation

## 2015-05-14 DIAGNOSIS — E785 Hyperlipidemia, unspecified: Secondary | ICD-10-CM | POA: Diagnosis not present

## 2015-05-14 DIAGNOSIS — F319 Bipolar disorder, unspecified: Secondary | ICD-10-CM | POA: Diagnosis not present

## 2015-05-14 DIAGNOSIS — E669 Obesity, unspecified: Secondary | ICD-10-CM | POA: Insufficient documentation

## 2015-05-14 DIAGNOSIS — I10 Essential (primary) hypertension: Secondary | ICD-10-CM | POA: Diagnosis not present

## 2015-05-14 MED ORDER — KETOROLAC TROMETHAMINE 60 MG/2ML IM SOLN
60.0000 mg | Freq: Once | INTRAMUSCULAR | Status: AC
  Administered 2015-05-14: 60 mg via INTRAMUSCULAR
  Filled 2015-05-14: qty 2

## 2015-05-14 NOTE — ED Provider Notes (Signed)
CSN: [id number]     Arrival date & time 05/14/15  0151 History   First MD Initiated Contact with Patient 05/14/15 0158     Chief Complaint  Patient presents with  . Back Pain     Patient is a 49 y.o. male presenting with back pain. The history is provided by the patient.  Back Pain Location:  Lumbar spine Quality:  Aching Radiates to:  L thigh Pain severity:  Moderate Onset quality:  Gradual Duration:  6 days Timing:  Constant Progression:  Worsening Chronicity:  New Context: lifting heavy objects   Relieved by:  Nothing Worsened by:  Movement Associated symptoms: numbness and weakness   Associated symptoms: no abdominal pain, no bladder incontinence, no bowel incontinence, no chest pain and no fever   PT REPORTS BACK PAIN FOR 6 DAYS IT STARTED AFTER LIFTING ROCKS LAST WEEK NO FALLS REPORTED PREVIOUS H/O LUMBAR FUSION NO FEVER/CHILLS HE REPORTS HIS LEFT LEG FEELS WEAK AND NUMB   Past Medical History  Diagnosis Date  . Bronchitis   . Pneumonia   . Bipolar 1 disorder ([place])   . GERD (gastroesophageal reflux disease)   . Chronic low back pain   . Hypogonadism in male   . Hyperlipidemia   . Hypertension   . Sleep apnea   . Obesity   . Neuropathy ([place])   . Migraine with aura    Past Surgical History  Procedure Laterality Date  . Leg surgery Left 1986    (1986) L leg pin/plate s/p trauma  . Back surgery  2001    L5-S1 fusion (2001) s/p issue after spinal injury in 1986 ("fell off a cliff in [place]"  . Inner ear surgery Right     Ear drum reconstruction   Family History  Problem Relation Age of Onset  . Hypertension Mother   . Heart disease Father   . Diabetes Father   . Hyperlipidemia Father   . Hypertension Father    Social History  Substance Use Topics  . Smoking status: Former Smoker    Quit date: 08/06/2006  . Smokeless tobacco: Never Used  . Alcohol Use: No    Review of Systems  Constitutional: Negative for fever.  Cardiovascular: Negative for  chest pain.  Gastrointestinal: Negative for abdominal pain and bowel incontinence.  Genitourinary: Negative for bladder incontinence.  Musculoskeletal: Positive for back pain.  Neurological: Positive for weakness and numbness.  All other systems reviewed and are negative.     Allergies  Ace inhibitors  Home Medications   Prior to Admission medications   Medication Sig Start Date End Date Taking? Authorizing Provider  atorvastatin (LIPITOR) 40 MG tablet Take 1 tablet (40 mg total) by mouth daily. 04/25/15   Megan P Johnson, DO  cyclobenzaprine (FLEXERIL) 10 MG tablet Take 1 tablet (10 mg total) by mouth 3 (three) times daily as needed for muscle spasms. 05/12/15   Megan P Johnson, DO  gabapentin (NEURONTIN) 300 MG capsule Take 3 capsules (900 mg total) by mouth 3 (three) times daily. 04/22/15   Megan P Johnson, DO  ibuprofen (ADVIL,MOTRIN) 600 MG tablet Take 1 tablet (600 mg total) by mouth every 8 (eight) hours as needed. 05/12/15   Megan P Johnson, DO  metoprolol tartrate (LOPRESSOR) 25 MG tablet Take 1 tablet (25 mg total) by mouth daily. 04/22/15   Megan P Johnson, DO  Multiple Vitamins-Minerals (ONE DAILY MENS) TABS Take 1 tablet by mouth daily.      Historical Provider, MD  omeprazole ([place])  40 MG capsule Take 1 capsule (40 mg total) by mouth daily. 04/22/15   Megan P Johnson, DO   BP 160/90 mmHg  Pulse 89  Temp(Src) 98 F (36.7 C) (Oral)  Resp 16  Ht 5\' 11"  (1.803 m)  Wt 270 lb (122.471 kg)  BMI 37.67 kg/m2  SpO2 100% Physical Exam CONSTITUTIONAL: Well developed/well nourished HEAD: Normocephalic/atraumatic EYES: EOMI/PERRL ENMT: Mucous membranes moist NECK: supple no meningeal signs SPINE/BACK:entire spine nontender, lumbar paraspinal tenderness, No warmth/bruising/crepitance/stepoffs noted to spine CV: S1/S2 noted, no murmurs/rubs/gallops noted LUNGS: Lungs are clear to auscultation bilaterally, no apparent distress ABDOMEN: soft, nontender, no rebound or  guarding GU:no cva tenderness NEURO: Awake/alert,equal motor 5/5 strength noted with the following: hip flexion/knee flexion/extension, foot dorsi/plantar flexion, great toe extension intact bilaterally, no clonus bilaterally, plantar reflex appropriate (toes downgoing), no sensory deficit.  Equal patellar/achilles reflex noted in bilateral lower extremities.  Pt is able to ambulate unassisted. EXTREMITIES: pulses normal, full ROM SKIN: warm, color normal PSYCH: no abnormalities of mood noted, alert and oriented to situation   ED Course  Procedures  Imaging Review Ct Lumbar Spine Wo Contrast  05/13/2015   CLINICAL DATA:  Back pain for 6 days. Patient lose the injured his back lifting a bag of rocks. History of previous lumbar spine fusion. Pain is in the mid back to the top of the left leg.  EXAM: CT LUMBAR SPINE WITHOUT CONTRAST  TECHNIQUE: Multidetector CT imaging of the lumbar spine was performed without intravenous contrast administration. Multiplanar CT image reconstructions were also generated.  COMPARISON:  Radiographs, 09/06/2014  FINDINGS: Posterior lumbar spine fusion has been performed at L4, L5 and S1. Bilateral pedicle screws are noted at L4 and S1. A screw extends along the anterior to central aspect of S1 and from the lower endplate of L4 to the upper central vertebrae. Intervertebral bone graft material is noted. There is bony fusion across most of the L4-L5 disc interspace. There is no evidence of loosening of the orthopedic hardware. There is no evidence of a pseudoarthrosis.  No fracture. No spondylolisthesis. Mild loss disc height at L3-L4. There is loss of disc height at diffuse levels. Remaining disc spaces are well preserved.  There is a prominent Schmorl's node noted along the upper endplate of L38. There is mild loss disc height with endplate osteophytes at this level.  T11-T12:  Minimal disc bulging.  No stenosis.  No disc herniation.  T12-L1:  Unremarkable.  L1-L2:   Unremarkable.  L2-L3: Mild facet degenerative change. No significant disc bulging or disc herniation. No significant stenosis.  L3-L4: Mild to moderate bilateral facet degenerative change. Minimal disc bulging. No disc herniation. No significant stenosis.  L4-L5:  No significant stenosis.  L5-S1: Mild left neural foraminal narrowing from endplate spurring. No disc herniation. No evidence of nerve root impingement.  Surrounding soft tissues: Small nonobstructing intrarenal stones. Low-density area in the midpole the right kidney which may reflect a cyst. Otherwise unremarkable.  IMPRESSION: 1. No fracture or acute finding. 2. Status post lumbar spine fusion from L4 through S1. Fusion hardware is well-seated with no evidence of loosening. Mature bone graft material replaces the L4-L5 and L5-S1 discs. 3. Mild degenerative changes as described. No disc herniations. Mild left neural foraminal narrowing L5-S1. No other stenosis. No evidence of nerve root impingement.   Electronically Signed   By: Lajean Manes M.D.   On: 05/13/2015 14:15   2:32 AM Pt with back pain for one week He initially denied having visited the  ER at [facility] on 10/7, but on further discussion he was able to recall this visit His CT lumbar spine at that time did not reveal any acute pathology He denies h/o chronic pain but reports he receives vicodin on a monthly basis   He has no signs of acute spinal pathology He can ambulate without difficulty I feel he is safe for d/c home We discussed strict ER return precautions  He reports he is moving to [place] this week.  Advised him to f/u with spine specialist once he is settled in [place]  Medications  ketorolac (TORADOL) injection 60 mg (60 mg Intramuscular Given 05/14/15 0234)    MDM   Final diagnoses:  Low back pain, unspecified back pain laterality, with sciatica presence unspecified    Nursing notes including past medical history and social history reviewed and considered  in documentation Previous records reviewed and considered Narcotic database reviewed and considered in decision making     Ripley Fraise, MD 05/14/15 [id number]

## 2015-05-14 NOTE — Discharge Instructions (Signed)
SEEK IMMEDIATE MEDICAL ATTENTION IF: New numbness, tingling, weakness, or problem with the use of your arms or legs.  Severe back pain not relieved with medications.  Change in bowel or bladder control (if you lose control of stool or urine, or if you are unable to urinate) Increasing pain in any areas of the body (such as chest or abdominal pain).  Shortness of breath, dizziness or fainting.  Nausea (feeling sick to your stomach), vomiting, fever, or sweats.   Back Exercises The following exercises strengthen the muscles that help to support the back. They also help to keep the lower back flexible. Doing these exercises can help to prevent back pain or lessen existing pain. If you have back pain or discomfort, try doing these exercises 2-3 times each day or as told by your health care provider. When the pain goes away, do them once each day, but increase the number of times that you repeat the steps for each exercise (do more repetitions). If you do not have back pain or discomfort, do these exercises once each day or as told by your health care provider. EXERCISES Single Knee to Chest Repeat these steps 3-5 times for each leg: 1. Lie on your back on a firm bed or the floor with your legs extended. 2. Bring one knee to your chest. Your other leg should stay extended and in contact with the floor. 3. Hold your knee in place by grabbing your knee or thigh. 4. Pull on your knee until you feel a gentle stretch in your lower back. 5. Hold the stretch for 10-30 seconds. 6. Slowly release and straighten your leg. Pelvic Tilt Repeat these steps 5-10 times: 1. Lie on your back on a firm bed or the floor with your legs extended. 2. Bend your knees so they are pointing toward the ceiling and your feet are flat on the floor. 3. Tighten your lower abdominal muscles to press your lower back against the floor. This motion will tilt your pelvis so your tailbone points up toward the ceiling instead of  pointing to your feet or the floor. 4. With gentle tension and even breathing, hold this position for 5-10 seconds. Cat-Cow Repeat these steps until your lower back becomes more flexible: 1. Get into a hands-and-knees position on a firm surface. Keep your hands under your shoulders, and keep your knees under your hips. You may place padding under your knees for comfort. 2. Let your head hang down, and point your tailbone toward the floor so your lower back becomes rounded like the back of a cat. 3. Hold this position for 5 seconds. 4. Slowly lift your head and point your tailbone up toward the ceiling so your back forms a sagging arch like the back of a cow. 5. Hold this position for 5 seconds. Press-Ups Repeat these steps 5-10 times: 1. Lie on your abdomen (face-down) on the floor. 2. Place your palms near your head, about shoulder-width apart. 3. While you keep your back as relaxed as possible and keep your hips on the floor, slowly straighten your arms to raise the top half of your body and lift your shoulders. Do not use your back muscles to raise your upper torso. You may adjust the placement of your hands to make yourself more comfortable. 4. Hold this position for 5 seconds while you keep your back relaxed. 5. Slowly return to lying flat on the floor. Bridges Repeat these steps 10 times: 1. Lie on your back on a firm  surface. 2. Bend your knees so they are pointing toward the ceiling and your feet are flat on the floor. 3. Tighten your buttocks muscles and lift your buttocks off of the floor until your waist is at almost the same height as your knees. You should feel the muscles working in your buttocks and the back of your thighs. If you do not feel these muscles, slide your feet 1-2 inches farther away from your buttocks. 4. Hold this position for 3-5 seconds. 5. Slowly lower your hips to the starting position, and allow your buttocks muscles to relax completely. If this exercise is  too easy, try doing it with your arms crossed over your chest. Abdominal Crunches Repeat these steps 5-10 times: 1. Lie on your back on a firm bed or the floor with your legs extended. 2. Bend your knees so they are pointing toward the ceiling and your feet are flat on the floor. 3. Cross your arms over your chest. 4. Tip your chin slightly toward your chest without bending your neck. 5. Tighten your abdominal muscles and slowly raise your trunk (torso) high enough to lift your shoulder blades a tiny bit off of the floor. Avoid raising your torso higher than that, because it can put too much stress on your low back and it does not help to strengthen your abdominal muscles. 6. Slowly return to your starting position. Back Lifts Repeat these steps 5-10 times: 1. Lie on your abdomen (face-down) with your arms at your sides, and rest your forehead on the floor. 2. Tighten the muscles in your legs and your buttocks. 3. Slowly lift your chest off of the floor while you keep your hips pressed to the floor. Keep the back of your head in line with the curve in your back. Your eyes should be looking at the floor. 4. Hold this position for 3-5 seconds. 5. Slowly return to your starting position. SEEK MEDICAL CARE IF:  Your back pain or discomfort gets much worse when you do an exercise.  Your back pain or discomfort does not lessen within 2 hours after you exercise. If you have any of these problems, stop doing these exercises right away. Do not do them again unless your health care provider says that you can. SEEK IMMEDIATE MEDICAL CARE IF:  You develop sudden, severe back pain. If this happens, stop doing the exercises right away. Do not do them again unless your health care provider says that you can.   This information is not intended to replace advice given to you by your health care provider. Make sure you discuss any questions you have with your health care provider.   Document Released:  08/30/2004 Document Revised: 04/13/2015 Document Reviewed: 09/16/2014 Elsevier Interactive Patient Education [employer].

## 2015-05-14 NOTE — ED Notes (Signed)
Patient reports that he is having pain to his lower back and to numbness to his left leg since Saturday. -

## 2015-05-14 NOTE — ED Notes (Signed)
Pt states was lifting rocks Saturday and pulled his back.  States pain radiates to left leg and pain is getting worse

## 2015-12-12 ENCOUNTER — Telehealth: Payer: Self-pay | Admitting: Family Medicine

## 2015-12-12 NOTE — Telephone Encounter (Signed)
Spoke with patient, he wanted to know if Dr.Johnson would continue care for him and his wife, spoke with Dr.Johnson, she recommends that they try to find a doctor that is close to there home that is 3 hours away. She states that she will see them again, but it would be very hard for them to commute especially if they are sick.

## 2015-12-12 NOTE — Telephone Encounter (Signed)
Pt called would like a call back from [place], pt stated Tiffany would know what this was about. Thanks.

## 2016-05-16 IMAGING — CT CT L SPINE W/O CM
1 of 8 series · 5 of 14 positions shown, 7 images · non-contrast
Comparison: Radiographs, 09/06/2014

CLINICAL DATA: Back pain for 6 days. Patient lose the injured his
back lifting a bag of rocks. History of previous lumbar spine
fusion. Pain is in the mid back to the top of the left leg.

EXAM:
CT LUMBAR SPINE WITHOUT CONTRAST
TECHNIQUE: Multidetector CT imaging of the lumbar spine was performed without
intravenous contrast administration. Multiplanar CT image
reconstructions were also generated.

[Series 3: l spine soft · axial · 0.41mm/px · z∈[-380,-186]mm · 5 of 147 slices shown, 7 images]
[im 25/147  soft-tissue]
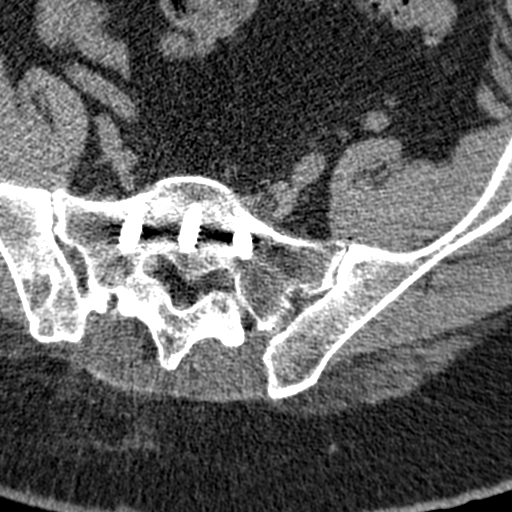
[im 25/147  bone]
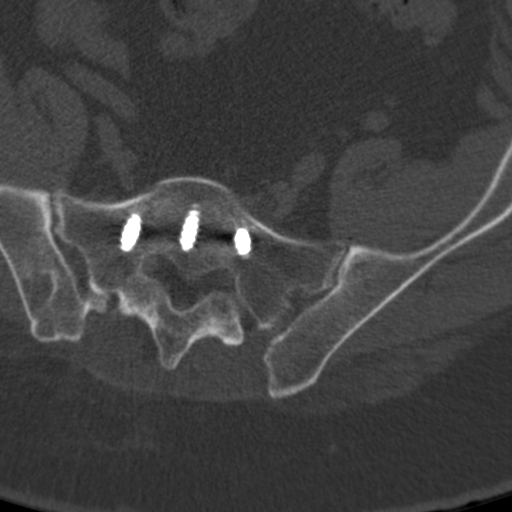
[im 49/147  bone]
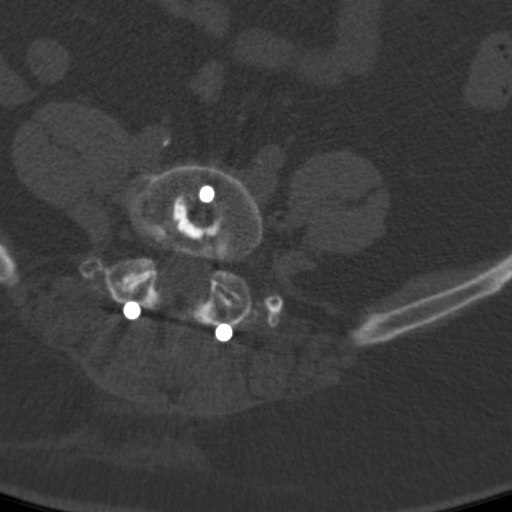
[im 74/147  bone]
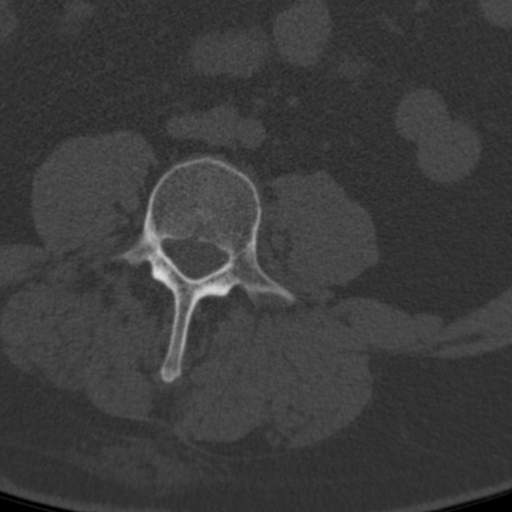
[im 98/147  bone]
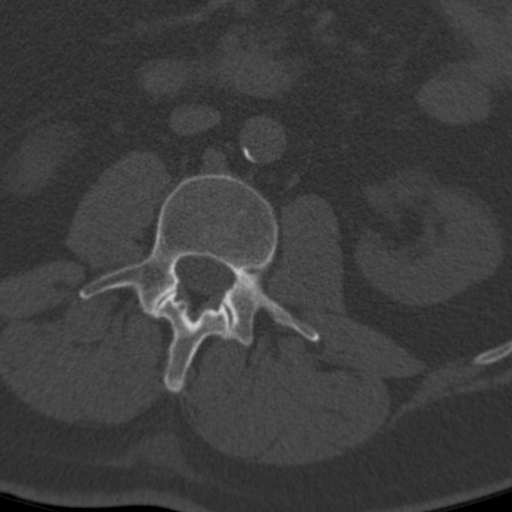
[im 122/147  soft-tissue]
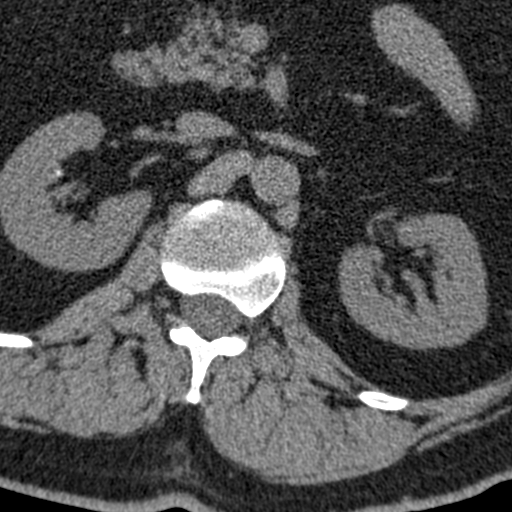
[im 122/147  bone]
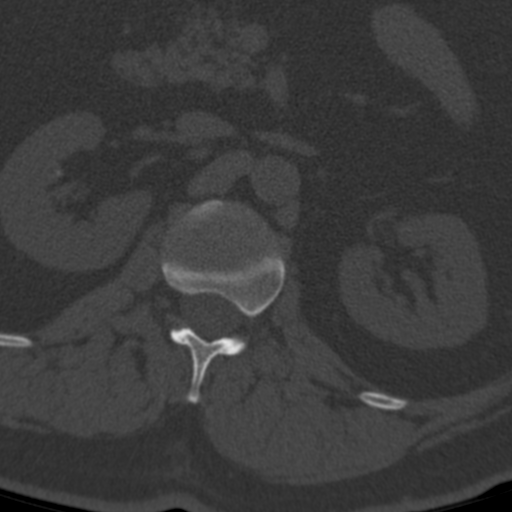

[5 of 14 positions shown; findings below may reference images not displayed]

FINDINGS: Posterior lumbar spine fusion has been performed at L4, L5 and S1.
Bilateral pedicle screws are noted at L4 and S1. A screw extends
along the anterior to central aspect of S1 and from the lower
endplate of L4 to the upper central vertebrae. Intervertebral bone
graft material is noted. There is bony fusion across most of the
L4-L5 disc interspace. There is no evidence of loosening of the
orthopedic hardware. There is no evidence of a pseudoarthrosis.

No fracture. No spondylolisthesis. Mild loss disc height at L3-L4.
There is loss of disc height at diffuse levels. Remaining disc
spaces are well preserved.

There is a prominent Schmorl's node noted along the upper endplate
of T12. There is mild loss disc height with endplate osteophytes at
this level.

T11-T12:  Minimal disc bulging.  No stenosis.  No disc herniation.

T12-L1:  Unremarkable.

L1-L2:  Unremarkable.

L2-L3: Mild facet degenerative change. No significant disc bulging
or disc herniation. No significant stenosis.

L3-L4: Mild to moderate bilateral facet degenerative change. Minimal
disc bulging. No disc herniation. No significant stenosis.

L4-L5:  No significant stenosis.

L5-S1: Mild left neural foraminal narrowing from endplate spurring.
No disc herniation. No evidence of nerve root impingement.

Surrounding soft tissues: Small nonobstructing intrarenal stones.
Low-density area in the midpole the right kidney which may reflect a
cyst. Otherwise unremarkable.
IMPRESSION: 1. No fracture or acute finding.
2. Status post lumbar spine fusion from L4 through S1. Fusion
hardware is well-seated with no evidence of loosening. Mature bone
graft material replaces the L4-L5 and L5-S1 discs.
3. Mild degenerative changes as described. No disc herniations. Mild
left neural foraminal narrowing L5-S1. No other stenosis. No
evidence of nerve root impingement.
